# Patient Record
Sex: Female | Born: 1955 | Race: White | Hispanic: No | Marital: Married | Smoking: Former smoker
Health system: Southern US, Community
[De-identification: ages and names within clinical notes are randomized; demographics above are authoritative.]

## PROBLEM LIST (undated history)

## (undated) DIAGNOSIS — I10 Essential (primary) hypertension: Secondary | ICD-10-CM

## (undated) DIAGNOSIS — F329 Major depressive disorder, single episode, unspecified: Secondary | ICD-10-CM

## (undated) DIAGNOSIS — F419 Anxiety disorder, unspecified: Secondary | ICD-10-CM

## (undated) DIAGNOSIS — K449 Diaphragmatic hernia without obstruction or gangrene: Secondary | ICD-10-CM

## (undated) DIAGNOSIS — K219 Gastro-esophageal reflux disease without esophagitis: Secondary | ICD-10-CM

## (undated) DIAGNOSIS — F32A Depression, unspecified: Secondary | ICD-10-CM

## (undated) DIAGNOSIS — T7840XA Allergy, unspecified, initial encounter: Secondary | ICD-10-CM

## (undated) DIAGNOSIS — E785 Hyperlipidemia, unspecified: Secondary | ICD-10-CM

## (undated) HISTORY — PX: ABDOMINAL HYSTERECTOMY: SHX81

## (undated) HISTORY — DX: Allergy, unspecified, initial encounter: T78.40XA

## (undated) HISTORY — PX: TONSILLECTOMY: SUR1361

## (undated) HISTORY — DX: Anxiety disorder, unspecified: F41.9

## (undated) HISTORY — PX: APPENDECTOMY: SHX54

## (undated) HISTORY — DX: Diaphragmatic hernia without obstruction or gangrene: K44.9

## (undated) HISTORY — PX: TUBAL LIGATION: SHX77

---

## 2003-12-04 ENCOUNTER — Ambulatory Visit: Payer: Self-pay | Admitting: Family Medicine

## 2004-02-10 ENCOUNTER — Other Ambulatory Visit: Payer: Self-pay

## 2004-02-20 ENCOUNTER — Inpatient Hospital Stay: Payer: Self-pay | Admitting: Unknown Physician Specialty

## 2004-03-25 ENCOUNTER — Ambulatory Visit: Payer: Self-pay | Admitting: Family Medicine

## 2006-03-15 ENCOUNTER — Ambulatory Visit: Payer: Self-pay | Admitting: Family Medicine

## 2007-05-23 ENCOUNTER — Ambulatory Visit: Payer: Self-pay | Admitting: Family Medicine

## 2008-04-11 ENCOUNTER — Ambulatory Visit: Payer: Self-pay | Admitting: Family Medicine

## 2008-07-09 ENCOUNTER — Ambulatory Visit: Payer: Self-pay | Admitting: Gastroenterology

## 2009-05-27 ENCOUNTER — Ambulatory Visit: Payer: Self-pay | Admitting: Family Medicine

## 2010-07-07 ENCOUNTER — Ambulatory Visit: Payer: Self-pay | Admitting: Family Medicine

## 2011-06-28 ENCOUNTER — Encounter (HOSPITAL_BASED_OUTPATIENT_CLINIC_OR_DEPARTMENT_OTHER): Payer: Self-pay | Admitting: *Deleted

## 2011-06-28 ENCOUNTER — Other Ambulatory Visit: Payer: Self-pay | Admitting: Orthopedic Surgery

## 2011-06-28 NOTE — Progress Notes (Signed)
Will need ekg and istat-in [place] now

## 2011-06-29 ENCOUNTER — Encounter (HOSPITAL_BASED_OUTPATIENT_CLINIC_OR_DEPARTMENT_OTHER)
Admission: RE | Disposition: A | Discharge status: Home / Self Care | Payer: Self-pay | Source: Ambulatory Visit | Attending: Orthopedic Surgery

## 2011-06-29 ENCOUNTER — Other Ambulatory Visit: Payer: Self-pay | Admitting: Orthopedic Surgery

## 2011-06-29 ENCOUNTER — Encounter (HOSPITAL_BASED_OUTPATIENT_CLINIC_OR_DEPARTMENT_OTHER): Payer: Self-pay | Admitting: Anesthesiology

## 2011-06-29 ENCOUNTER — Ambulatory Visit (HOSPITAL_BASED_OUTPATIENT_CLINIC_OR_DEPARTMENT_OTHER): Payer: 59 | Admitting: Anesthesiology

## 2011-06-29 ENCOUNTER — Other Ambulatory Visit: Payer: Self-pay

## 2011-06-29 ENCOUNTER — Encounter (HOSPITAL_BASED_OUTPATIENT_CLINIC_OR_DEPARTMENT_OTHER): Payer: Self-pay | Admitting: *Deleted

## 2011-06-29 ENCOUNTER — Ambulatory Visit (HOSPITAL_BASED_OUTPATIENT_CLINIC_OR_DEPARTMENT_OTHER)
Admission: RE | Admit: 2011-06-29 | Discharge: 2011-06-29 | Disposition: A | Discharge status: Home / Self Care | Payer: 59 | Source: Ambulatory Visit | Attending: Orthopedic Surgery | Admitting: Orthopedic Surgery

## 2011-06-29 ENCOUNTER — Encounter (HOSPITAL_BASED_OUTPATIENT_CLINIC_OR_DEPARTMENT_OTHER): Payer: Self-pay | Admitting: Orthopedic Surgery

## 2011-06-29 DIAGNOSIS — I10 Essential (primary) hypertension: Secondary | ICD-10-CM | POA: Insufficient documentation

## 2011-06-29 DIAGNOSIS — M654 Radial styloid tenosynovitis [de Quervain]: Secondary | ICD-10-CM | POA: Insufficient documentation

## 2011-06-29 DIAGNOSIS — K219 Gastro-esophageal reflux disease without esophagitis: Secondary | ICD-10-CM | POA: Insufficient documentation

## 2011-06-29 HISTORY — DX: Essential (primary) hypertension: I10

## 2011-06-29 HISTORY — DX: Major depressive disorder, single episode, unspecified: F32.9

## 2011-06-29 HISTORY — DX: Depression, unspecified: F32.A

## 2011-06-29 HISTORY — PX: DORSAL COMPARTMENT RELEASE: SHX5039

## 2011-06-29 HISTORY — DX: Hyperlipidemia, unspecified: E78.5

## 2011-06-29 HISTORY — DX: Gastro-esophageal reflux disease without esophagitis: K21.9

## 2011-06-29 LAB — POCT I-STAT, CHEM 8
Glucose, Bld: 98 mg/dL (ref 70–99)
HCT: 42 % (ref 36.0–46.0)
Hemoglobin: 14.3 g/dL (ref 12.0–15.0)
Potassium: 3.5 mEq/L (ref 3.5–5.1)

## 2011-06-29 SURGERY — RELEASE, FIRST DORSAL COMPARTMENT, HAND
Anesthesia: General | Site: Hand | Laterality: Right | Wound class: Clean

## 2011-06-29 MED ORDER — BUPIVACAINE HCL (PF) 0.25 % IJ SOLN
INTRAMUSCULAR | Status: DC | PRN
  Administered 2011-06-29: 5 mL

## 2011-06-29 MED ORDER — CHLORHEXIDINE GLUCONATE 4 % EX LIQD: 60.0000 mL | Freq: Once | CUTANEOUS | Status: DC

## 2011-06-29 MED ORDER — MIDAZOLAM HCL 5 MG/5ML IJ SOLN
INTRAMUSCULAR | Status: DC | PRN
  Administered 2011-06-29: 2 mg via INTRAVENOUS

## 2011-06-29 MED ORDER — FENTANYL CITRATE 0.05 MG/ML IJ SOLN
INTRAMUSCULAR | Status: DC | PRN
  Administered 2011-06-29 (×2): 50 ug via INTRAVENOUS

## 2011-06-29 MED ORDER — ONDANSETRON HCL 4 MG/2ML IJ SOLN
INTRAMUSCULAR | Status: DC | PRN
  Administered 2011-06-29: 4 mg via INTRAVENOUS

## 2011-06-29 MED ORDER — LIDOCAINE HCL (CARDIAC) 20 MG/ML IV SOLN
INTRAVENOUS | Status: DC | PRN
  Administered 2011-06-29: 50 mg via INTRAVENOUS

## 2011-06-29 MED ORDER — HYDROMORPHONE HCL PF 1 MG/ML IJ SOLN
0.2500 mg | INTRAMUSCULAR | Status: DC | PRN
  Administered 2011-06-29: 0.25 mg via INTRAVENOUS
  Administered 2011-06-29: 0.5 mg via INTRAVENOUS

## 2011-06-29 MED ORDER — LACTATED RINGERS IV SOLN
INTRAVENOUS | Status: DC
  Administered 2011-06-29 (×2): via INTRAVENOUS

## 2011-06-29 MED ORDER — CEFAZOLIN SODIUM 1-5 GM-% IV SOLN
INTRAVENOUS | Status: DC | PRN
  Administered 2011-06-29: 1 g via INTRAVENOUS

## 2011-06-29 MED ORDER — PROPOFOL 10 MG/ML IV EMUL
INTRAVENOUS | Status: DC | PRN
  Administered 2011-06-29: 200 mg via INTRAVENOUS

## 2011-06-29 MED ORDER — DEXAMETHASONE SODIUM PHOSPHATE 4 MG/ML IJ SOLN
INTRAMUSCULAR | Status: DC | PRN
  Administered 2011-06-29: 10 mg via INTRAVENOUS

## 2011-06-29 MED ORDER — PENTAZOCINE-NALOXONE 50-0.5 MG PO TABS: 1.0000 | ORAL_TABLET | ORAL | Status: AC | PRN

## 2011-06-29 SURGICAL SUPPLY — 38 items
BANDAGE COBAN STERILE 2 (GAUZE/BANDAGES/DRESSINGS) IMPLANT
BANDAGE CONFORM 2  STR LF (GAUZE/BANDAGES/DRESSINGS) ×2 IMPLANT
BLADE MINI RND TIP GREEN BEAV (BLADE) IMPLANT
BLADE SURG 15 STRL LF DISP TIS (BLADE) ×2 IMPLANT
BLADE SURG 15 STRL SS (BLADE) ×2
BNDG ELASTIC 2 VLCR STRL LF (GAUZE/BANDAGES/DRESSINGS) ×2 IMPLANT
BNDG ESMARK 4X9 LF (GAUZE/BANDAGES/DRESSINGS) IMPLANT
CHLORAPREP W/TINT 26ML (MISCELLANEOUS) ×2 IMPLANT
CLOTH BEACON ORANGE TIMEOUT ST (SAFETY) ×2 IMPLANT
CORDS BIPOLAR (ELECTRODE) ×2 IMPLANT
COVER MAYO STAND STRL (DRAPES) ×2 IMPLANT
COVER TABLE BACK 60X90 (DRAPES) ×2 IMPLANT
CUFF TOURNIQUET SINGLE 18IN (TOURNIQUET CUFF) ×2 IMPLANT
DRAPE EXTREMITY T 121X128X90 (DRAPE) ×2 IMPLANT
DRAPE SURG 17X23 STRL (DRAPES) ×2 IMPLANT
GAUZE XEROFORM 1X8 LF (GAUZE/BANDAGES/DRESSINGS) ×2 IMPLANT
GLOVE BIO SURGEON STRL SZ 6.5 (GLOVE) ×2 IMPLANT
GLOVE BIO SURGEON STRL SZ7.5 (GLOVE) ×2 IMPLANT
GLOVE BIOGEL PI IND STRL 7.0 (GLOVE) ×1 IMPLANT
GLOVE BIOGEL PI INDICATOR 7.0 (GLOVE) ×1
GLOVE SURG ORTHO 8.0 STRL STRW (GLOVE) ×2 IMPLANT
GOWN PREVENTION PLUS XLARGE (GOWN DISPOSABLE) ×2 IMPLANT
GOWN STRL REIN XL XLG (GOWN DISPOSABLE) ×4 IMPLANT
NEEDLE HYPO 25X1 1.5 SAFETY (NEEDLE) ×2 IMPLANT
NS IRRIG 1000ML POUR BTL (IV SOLUTION) ×2 IMPLANT
PACK BASIN DAY SURGERY FS (CUSTOM PROCEDURE TRAY) ×2 IMPLANT
PADDING CAST ABS 4INX4YD NS (CAST SUPPLIES) ×1
PADDING CAST ABS COTTON 4X4 ST (CAST SUPPLIES) ×1 IMPLANT
SPLINT PLASTER CAST XFAST 3X15 (CAST SUPPLIES) ×1 IMPLANT
SPLINT PLASTER XTRA FASTSET 3X (CAST SUPPLIES) ×1
SPONGE GAUZE 4X4 12PLY (GAUZE/BANDAGES/DRESSINGS) ×2 IMPLANT
STOCKINETTE 4X48 STRL (DRAPES) ×2 IMPLANT
SUT ETHILON 4 0 PS 2 18 (SUTURE) ×2 IMPLANT
SYR BULB 3OZ (MISCELLANEOUS) ×2 IMPLANT
SYR CONTROL 10ML LL (SYRINGE) ×2 IMPLANT
TOWEL OR 17X24 6PK STRL BLUE (TOWEL DISPOSABLE) ×4 IMPLANT
UNDERPAD 30X30 INCONTINENT (UNDERPADS AND DIAPERS) ×2 IMPLANT
WATER STERILE IRR 1000ML POUR (IV SOLUTION) IMPLANT

## 2011-06-29 NOTE — Anesthesia Procedure Notes (Signed)
Procedure Name: LMA Insertion Date/Time: 06/29/2011 10:36 AM Performed by: Caren Macadam Pre-anesthesia Checklist: Patient identified, Emergency Drugs available, Suction available and Patient being monitored Patient Re-evaluated:Patient Re-evaluated prior to inductionOxygen Delivery Method: Circle System Utilized Preoxygenation: Pre-oxygenation with 100% oxygen Intubation Type: IV induction Ventilation: Mask ventilation without difficulty LMA: LMA inserted LMA Size: 4.0 Number of attempts: 1 Airway Equipment and Method: bite block Placement Confirmation: positive ETCO2 and breath sounds checked- equal and bilateral Tube secured with: Tape Dental Injury: Teeth and Oropharynx as per pre-operative assessment

## 2011-06-29 NOTE — Brief Op Note (Signed)
06/29/2011  10:58 AM  PATIENT:  Nicole Buchanan  56 y.o. female  PRE-OPERATIVE DIAGNOSIS:  right dequervains  POST-OPERATIVE DIAGNOSIS:  right dequervains  PROCEDURE:  Procedure(s) (LRB): RELEASE DORSAL COMPARTMENT (DEQUERVAIN) (Right)  SURGEON:  Surgeon(s) and Role:    * Nicki Reaper, MD - Primary    * Tami Ribas, MD - Assisting  PHYSICIAN ASSISTANT:   ASSISTANTS: Karlyn Agee, MD   ANESTHESIA:   local and general  EBL:  Total I/O In: 1400 [I.V.:1400] Out: -   BLOOD ADMINISTERED:none  DRAINS: none   LOCAL MEDICATIONS USED:  MARCAINE     SPECIMEN:  No Specimen  DISPOSITION OF SPECIMEN:  N/A  COUNTS:  YES  TOURNIQUET:   Total Tourniquet Time Documented: Upper Arm (Right) - 12 minutes  DICTATION: .Other Dictation: Dictation Number [id number]  PLAN OF CARE: Discharge to home after PACU  PATIENT DISPOSITION:  PACU - hemodynamically stable.

## 2011-06-29 NOTE — Anesthesia Postprocedure Evaluation (Signed)
  Anesthesia Post-op Note  Patient: Nicole Buchanan  Procedure(s) Performed: Procedure(s) (LRB): RELEASE DORSAL COMPARTMENT (DEQUERVAIN) (Right)  Patient Location: PACU  Anesthesia Type: General  Level of Consciousness: awake, alert  and oriented  Airway and Oxygen Therapy: Patient Spontanous Breathing  Post-op Pain: none  Post-op Assessment: Post-op Vital signs reviewed, Patient's Cardiovascular Status Stable, Respiratory Function Stable, Patent Airway, No signs of Nausea or vomiting and Pain level controlled  Post-op Vital Signs: Reviewed and stable  Complications: No apparent anesthesia complications

## 2011-06-29 NOTE — H&P (Signed)
[name] is a 56 year old former patient. She is left handed. She comes in complaining of her right wrist. She had a prior history of deQuervain's treated on her left side and resolved with injections. She has very similar symptoms on her right side that has been going on for the past 3 weeks. She has no history of injury to it. She complains of an intermittent sharp pain which will occasionally awaken her at night. It is severe in nature. She feels it is gradually getting worse. Activity and work makes this worse, rest makes it better. She has tried a brace and ibuprofen, none of which have helped. No history of diabetes, thyroid problems, arthritis or gout. There is a family history of diabetes and thyroid problems.  Past Medical History: She is on Simvastatin, Lisinopril, HCTZ, and Venlafaxine. She has an allergy to PCN and Codeine.  Family Medical History: Positive for diabetes, high BP and arthritis.  Social History: She does not smoke. She drinks socially. She is married and a Administrator, sports.  Review of Systems: Positive for glasses, high BP, depression, and headaches, otherwise negative. Nicole Buchanan is an 56 y.o. female.   Chief Complaint: dequervain's rt HPI:  See above  Past Medical History  Diagnosis Date  . Hypertension   . Depression   . GERD (gastroesophageal reflux disease)   . Hyperlipemia     Past Surgical History  Procedure Date  . Tonsillectomy   . Abdominal hysterectomy   . Cesarean section     History reviewed. No pertinent family history. Social History:  reports that she quit smoking about 10 years ago. She does not have any smokeless tobacco history on file. She reports that she drinks alcohol. She reports that she does not use illicit drugs.  Allergies:  Allergies  Allergen Reactions  . Codeine Nausea And Vomiting  . Penicillins     dizzy    Medications Prior to Admission  Medication Sig Dispense Refill  . hydrochlorothiazide (HYDRODIURIL) 25 MG  tablet Take 25 mg by mouth daily.      Marland Kitchen lisinopril (PRINIVIL,ZESTRIL) 5 MG tablet Take 5 mg by mouth daily.      Marland Kitchen omeprazole (PRILOSEC) 20 MG capsule Take 20 mg by mouth daily.      . simvastatin (ZOCOR) 20 MG tablet Take 20 mg by mouth every evening.      . venlafaxine (EFFEXOR) 75 MG tablet Take 75 mg by mouth 2 (two) times daily.        Results for orders placed during the hospital encounter of 06/29/11 (from the past 48 hour(s))  POCT I-STAT, CHEM 8     Status: Normal   Collection Time   06/29/11  9:15 AM      Component Value Range Comment   Sodium 141  135 - 145 (mEq/L)    Potassium 3.5  3.5 - 5.1 (mEq/L)    Chloride 106  96 - 112 (mEq/L)    BUN 13  6 - 23 (mg/dL)    Creatinine, Ser 1.61  0.50 - 1.10 (mg/dL)    Glucose, Bld 98  70 - 99 (mg/dL)    Calcium, Ion 0.96  1.12 - 1.32 (mmol/L)    TCO2 25  0 - 100 (mmol/L)    Hemoglobin 14.3  12.0 - 15.0 (g/dL)    HCT 04.5  40.9 - 81.1 (%)     No results found.   Pertinent items are noted in HPI.  Blood pressure 132/87, pulse 79, temperature 98.2  F (36.8 C), temperature source Oral, resp. rate 16, height 5\' 1"  (1.549 m), weight 63.504 kg (140 lb), SpO2 98.00%.  General appearance: alert, cooperative and appears stated age Head: Normocephalic, without obvious abnormality Neck: no adenopathy Resp: clear to auscultation bilaterally Cardio: regular rate and rhythm, S1, S2 normal, no murmur, click, rub or gallop GI: soft, non-tender; bowel sounds normal; no masses,  no organomegaly Extremities: extremities normal, atraumatic, no cyanosis or edema Pulses: 2+ and symmetric Skin: Skin color, texture, turgor normal. No rashes or lesions Neurologic: Grossly normal Incision/Wound: na  Assessment/Plan In that she is electing to proceed with the possibility of surgical decompression to the first dorsal compartment.  Deema Juncaj R 06/29/2011, 10:17 AM

## 2011-06-29 NOTE — Transfer of Care (Signed)
Immediate Anesthesia Transfer of Care Note  Patient: Nicole Buchanan  Procedure(s) Performed: Procedure(s) (LRB): RELEASE DORSAL COMPARTMENT (DEQUERVAIN) (Right)  Patient Location: PACU  Anesthesia Type: General  Level of Consciousness: awake and alert   Airway & Oxygen Therapy: Patient Spontanous Breathing and Patient connected to face mask oxygen  Post-op Assessment: Report given to PACU RN and Post -op Vital signs reviewed and stable  Post vital signs: Reviewed and stable  Complications: No apparent anesthesia complications

## 2011-06-29 NOTE — Op Note (Signed)
Nicole Buchanan, DEPREE NO.:  [id number]  MEDICAL RECORD NO.:  [id number]  LOCATION:                                 FACILITY:  PHYSICIAN:  Cindee Salt, M.D.            DATE OF BIRTH:  DATE OF PROCEDURE:  06/29/2011 DATE OF DISCHARGE:                              OPERATIVE REPORT   PREOPERATIVE DIAGNOSIS:  [name] tendinitis, right wrist.  POSTOP DIAGNOSIS:  [name] tendinitis, right wrist.  OPERATION:  Decompression of first dorsal compartment with excision of septum, right wrist.  SURGEON:  Cindee Salt, M.D.  ASSISTANT:  Betha Loa, MD  ANESTHESIA:  General with local infiltration.  ANESTHESIOLOGIST:  Dr. Jean Rosenthal.  HISTORY:  The patient is a 56 year old female with a history of De Quervain tendinitis.  This has not responded to conservative treatment. She has elected to undergo surgical decompression that has been present for a year.  Preoperative area, the patient is seen, the extremity marked by both the patient and surgeon.  Antibiotic given.  PROCEDURE:  The patient was brought to the operating room where a general anesthetic was carried out without difficulty.  She was prepped using ChloraPrep, supine position with the right arm free.  A 3 minutes dry time was allowed.  Time-out taken, confirming the patient and procedure.  After adequate anesthesia was afforded, a longitudinal incision was made in the skin over the first dorsal compartment, carried down through subcutaneous tissue.  The dorsal, sensory, radial nerve branches were identified and protected.  The dissection carried down to the first dorsal compartment.  This was incised on its most dorsal aspect.  The extensor pollicis brevis tendon was identified.  This was found to be in a separate sheath from the abductor pollicis longus.  The septum was excised releasing both the tendons.  Full flexion and extension was present, moderate tenosynovitis was present.  The wound was  copiously irrigated with saline.  The radial sensory nerve was protected throughout the procedure.  The skin was then closed with a subcuticular 4-0 Vicryl Rapide suture.  The wrist was placed through full range motion prior to closure revealing no subluxation to the extensor tendons.  A sterile compressive dressing, thumb spica splint was applied.  On deflation of the tourniquet, all fingers immediately pinked.  She was taken to the recovery room.          ______________________________ Cindee Salt, M.D.     GK/MEDQ  D:  06/29/2011  T:  06/29/2011  Job:  [id number]

## 2011-06-29 NOTE — Op Note (Signed)
Dictated number: [id number]

## 2011-06-29 NOTE — Anesthesia Preprocedure Evaluation (Signed)
Anesthesia Evaluation  Patient identified by MRN, date of birth, ID band Patient awake    Reviewed: Allergy & Precautions, H&P , NPO status , Patient's Chart, lab work & pertinent test results  History of Anesthesia Complications Negative for: history of anesthetic complications  Airway Mallampati: I TM Distance: >3 FB Neck ROM: Full    Dental No notable dental hx. (+) Teeth Intact and Dental Advisory Given   Pulmonary neg pulmonary ROS, former smoker (remote smoker ) breath sounds clear to auscultation  Pulmonary exam normal       Cardiovascular hypertension, Pt. on medications Rhythm:Regular Rate:Normal     Neuro/Psych negative neurological ROS     GI/Hepatic Neg liver ROS, GERD-  Medicated and Controlled,  Endo/Other  negative endocrine ROS  Renal/GU negative Renal ROS     Musculoskeletal   Abdominal   Peds  Hematology   Anesthesia Other Findings   Reproductive/Obstetrics                           Anesthesia Physical Anesthesia Plan  ASA: II  Anesthesia Plan: General   Post-op Pain Management:    Induction: Intravenous  Airway Management Planned: LMA  Additional Equipment:   Intra-op Plan:   Post-operative Plan:   Informed Consent: I have reviewed the patients History and Physical, chart, labs and discussed the procedure including the risks, benefits and alternatives for the proposed anesthesia with the patient or authorized representative who has indicated his/her understanding and acceptance.   Dental advisory given  Plan Discussed with: CRNA and Surgeon  Anesthesia Plan Comments: (Plan routine monitors, GA- LMA OK)        Anesthesia Quick Evaluation

## 2011-06-29 NOTE — Discharge Instructions (Addendum)

## 2011-06-30 ENCOUNTER — Encounter (HOSPITAL_BASED_OUTPATIENT_CLINIC_OR_DEPARTMENT_OTHER): Payer: Self-pay | Admitting: Orthopedic Surgery

## 2013-09-17 ENCOUNTER — Ambulatory Visit: Payer: Self-pay | Admitting: Family Medicine

## 2015-05-22 ENCOUNTER — Other Ambulatory Visit: Payer: Self-pay | Admitting: Family Medicine

## 2015-05-22 DIAGNOSIS — Z1231 Encounter for screening mammogram for malignant neoplasm of breast: Secondary | ICD-10-CM

## 2015-06-03 ENCOUNTER — Ambulatory Visit
Admission: RE | Admit: 2015-06-03 | Discharge: 2015-06-03 | Disposition: A | Discharge status: Home / Self Care | Payer: Managed Care, Other (non HMO) | Source: Ambulatory Visit | Attending: Family Medicine | Admitting: Family Medicine

## 2015-06-03 DIAGNOSIS — Z1231 Encounter for screening mammogram for malignant neoplasm of breast: Secondary | ICD-10-CM | POA: Diagnosis present

## 2015-11-24 ENCOUNTER — Emergency Department: Payer: Managed Care, Other (non HMO)

## 2015-11-24 ENCOUNTER — Encounter: Payer: Self-pay | Admitting: Emergency Medicine

## 2015-11-24 ENCOUNTER — Emergency Department
Admission: EM | Admit: 2015-11-24 | Discharge: 2015-11-24 | Disposition: A | Discharge status: Home / Self Care | Payer: Managed Care, Other (non HMO) | Attending: Emergency Medicine | Admitting: Emergency Medicine

## 2015-11-24 DIAGNOSIS — I10 Essential (primary) hypertension: Secondary | ICD-10-CM | POA: Insufficient documentation

## 2015-11-24 DIAGNOSIS — Z79899 Other long term (current) drug therapy: Secondary | ICD-10-CM | POA: Diagnosis not present

## 2015-11-24 DIAGNOSIS — R0602 Shortness of breath: Secondary | ICD-10-CM

## 2015-11-24 DIAGNOSIS — R079 Chest pain, unspecified: Secondary | ICD-10-CM

## 2015-11-24 DIAGNOSIS — Z87891 Personal history of nicotine dependence: Secondary | ICD-10-CM | POA: Insufficient documentation

## 2015-11-24 DIAGNOSIS — R0789 Other chest pain: Secondary | ICD-10-CM | POA: Diagnosis not present

## 2015-11-24 LAB — COMPREHENSIVE METABOLIC PANEL
ALBUMIN: 4.3 g/dL (ref 3.5–5.0)
ALK PHOS: 108 U/L (ref 38–126)
ALT: 29 U/L (ref 14–54)
ANION GAP: 8 (ref 5–15)
AST: 23 U/L (ref 15–41)
BILIRUBIN TOTAL: 0.6 mg/dL (ref 0.3–1.2)
BUN: 14 mg/dL (ref 6–20)
CALCIUM: 9.6 mg/dL (ref 8.9–10.3)
CO2: 27 mmol/L (ref 22–32)
Chloride: 104 mmol/L (ref 101–111)
Creatinine, Ser: 0.71 mg/dL (ref 0.44–1.00)
Glucose, Bld: 90 mg/dL (ref 65–99)
POTASSIUM: 3.4 mmol/L — AB (ref 3.5–5.1)
Sodium: 139 mmol/L (ref 135–145)
TOTAL PROTEIN: 7.5 g/dL (ref 6.5–8.1)

## 2015-11-24 LAB — CBC
HEMATOCRIT: 41.2 % (ref 35.0–47.0)
Hemoglobin: 14 g/dL (ref 12.0–16.0)
MCH: 30.2 pg (ref 26.0–34.0)
MCHC: 33.9 g/dL (ref 32.0–36.0)
MCV: 89.1 fL (ref 80.0–100.0)
Platelets: 240 10*3/uL (ref 150–440)
RBC: 4.62 MIL/uL (ref 3.80–5.20)
RDW: 14.3 % (ref 11.5–14.5)
WBC: 5.2 10*3/uL (ref 3.6–11.0)

## 2015-11-24 LAB — TROPONIN I

## 2015-11-24 LAB — FIBRIN DERIVATIVES D-DIMER (ARMC ONLY): FIBRIN DERIVATIVES D-DIMER (ARMC): 506 — AB (ref 0–499)

## 2015-11-24 MED ORDER — LORAZEPAM 1 MG PO TABS
2.0000 mg | ORAL_TABLET | Freq: Once | ORAL | Status: AC
  Administered 2015-11-24: 2 mg via ORAL
  Filled 2015-11-24: qty 2

## 2015-11-24 MED ORDER — IOPAMIDOL (ISOVUE-370) INJECTION 76%
75.0000 mL | Freq: Once | INTRAVENOUS | Status: AC | PRN
Start: 2015-11-24 — End: 2015-11-24
  Administered 2015-11-24: 75 mL via INTRAVENOUS
  Filled 2015-11-24: qty 75

## 2015-11-24 NOTE — ED Triage Notes (Signed)
States has had chest pain x 3 to 4 days, 2 days ago episode of diaphoresis. Sensation of being unable to take a deep breath and intermittent SOB.

## 2015-11-24 NOTE — ED Notes (Signed)
Did not close incision in leg; [facility]

## 2015-11-24 NOTE — ED Provider Notes (Signed)
Time Seen: Approximately1532  I have reviewed the triage notes  Chief Complaint: Chest Pain   History of Present Illness: Nicole Buchanan is a 60 y.o. female who presents with a 3 to four-day history of intermittent waves of chest discomfort the last anywhere from 5-10 minutes. She states she had one episode Saturday where she felt diaphoretic. She has some shortness of breath associated with the numbness and tingling in both of her hands. She denies any weakness. She denies any arm or jaw pain. She denies any pain toward the middle of the back or flank region. She is not aware of any relieving or exacerbating factors. She states she does have a history of anxiety and is currently under treatment by her primary physician. She states she's had a history of feelings of heart palpitations that was evaluated by cardiology with a Holter monitor. She has no strong cardiac risk factors except for remote family history and some hypertension.   Past Medical History:  Diagnosis Date  . Depression   . GERD (gastroesophageal reflux disease)   . Hyperlipemia   . Hypertension     There are no active problems to display for this patient.   Past Surgical History:  Procedure Laterality Date  . ABDOMINAL HYSTERECTOMY    . CESAREAN SECTION    . DORSAL COMPARTMENT RELEASE  06/29/2011   Procedure: RELEASE DORSAL COMPARTMENT (DEQUERVAIN);  Surgeon: Nicki Reaper, MD;  Location: [facility];  Service: Orthopedics;  Laterality: Right;  . TONSILLECTOMY      Past Surgical History:  Procedure Laterality Date  . ABDOMINAL HYSTERECTOMY    . CESAREAN SECTION    . DORSAL COMPARTMENT RELEASE  06/29/2011   Procedure: RELEASE DORSAL COMPARTMENT (DEQUERVAIN);  Surgeon: Nicki Reaper, MD;  Location: [facility];  Service: Orthopedics;  Laterality: Right;  . TONSILLECTOMY      Current Outpatient Rx  . Order #: [id number] Class: Historical Med  . Order #: [id number] Class: Historical Med  .  Order #: [id number] Class: Historical Med  . Order #: [id number] Class: Historical Med  . Order #: [id number] Class: Historical Med    Allergies:  Codeine and Penicillins  Family History: Family History  Problem Relation Age of Onset  . Breast cancer Cousin     maternal side -50's    Social History: Social History  Substance Use Topics  . Smoking status: Former Smoker    Quit date: 06/27/2001  . Smokeless tobacco: Not on file  . Alcohol use Yes     Review of Systems:   10 point review of systems was performed and was otherwise negative:  Constitutional: No fever Eyes: No visual disturbances ENT: No sore throat, ear pain Cardiac: no current chest discomfort in the last episode was just prior to arrival at 11:00 Respiratory: No shortness of breath, wheezing, or stridor Abdomen: No abdominal pain, no vomiting, No diarrhea Endocrine: No weight loss, No night sweats Extremities: No peripheral edema, cyanosis Skin: No rashes, easy bruising Neurologic: No focal weakness, trouble with speech or swollowing Urologic: No dysuria, Hematuria, or urinary frequency   Physical Exam:  ED Triage Vitals  Enc Vitals Group     BP 11/24/15 1157 139/85     Pulse Rate 11/24/15 1157 78     Resp 11/24/15 1157 18     Temp 11/24/15 1157 97.9 F (36.6 C)     Temp Source 11/24/15 1157 Oral     SpO2 11/24/15 1157 100 %     Weight  11/24/15 1158 150 lb (68 kg)     Height 11/24/15 1158 5\' 1"  (1.549 m)     Head Circumference --      Peak Flow --      Pain Score 11/24/15 1158 8     Pain Loc --      Pain Edu? --      Excl. in GC? --     General: Awake , Alert , and Oriented times 3; GCS 15 Head: Normal cephalic , atraumatic Eyes: Pupils equal , round, reactive to light Nose/Throat: No nasal drainage, patent upper airway without erythema or exudate.  Neck: Supple, Full range of motion, No anterior adenopathy or palpable thyroid masses Lungs: Clear to ascultation without wheezes , rhonchi, or  rales Heart: Regular rate, regular rhythm without murmurs , gallops , or rubs Abdomen: Soft, non tender without rebound, guarding , or rigidity; bowel sounds positive and symmetric in all 4 quadrants. No organomegaly .        Extremities: 2 plus symmetric pulses. No edema, clubbing or cyanosis Neurologic: normal ambulation, Motor symmetric without deficits, sensory intact Skin: warm, dry, no rashes   Labs:   All laboratory work was reviewed including any pertinent negatives or positives listed below:  Labs Reviewed  COMPREHENSIVE METABOLIC PANEL - Abnormal; Notable for the following:       Result Value   Potassium 3.4 (*)    All other components within normal limits  FIBRIN DERIVATIVES D-DIMER ([facility] ONLY) - Abnormal; Notable for the following:    Fibrin derivatives D-dimer ([facility]) 506 (*)    All other components within normal limits  CBC  TROPONIN I  TROPONIN I  Initial and repeat troponin at 3 hours were negative D-dimer test was only slightly elevated  EKG: #1  ED ECG REPORT I, Jennye Moccasin, the attending physician, personally viewed and interpreted this ECG.  Date: 11/24/2015 EKG Time: 1155 Rate: 79 Rhythm: normal sinus rhythm QRS Axis: normal Intervals: normal ST/T Wave abnormalities: normal Conduction Disturbances: none Narrative Interpretation: unremarkable Poor R-wave progression in the anterior leads No acute ischemic changes noted  EKG #2 ED ECG REPORT I, Jennye Moccasin, the attending physician, personally viewed and interpreted this ECG.  Date: 11/24/2015 EKG Time:1535 Rate: 81 Rhythm: normal sinus rhythm QRS Axis: normal Intervals: normal ST/T Wave abnormalities:Slightly prolonged QT interval Conduction Disturbances: none Narrative Interpretation: unremarkable No acute ischemic changes  Radiology:  "Dg Chest 2 View  Result Date: 11/24/2015 CLINICAL DATA:  Chest pain. EXAM: CHEST  2 VIEW COMPARISON:  No recent prior. FINDINGS: Mediastinum and  hilar structures normal. Lungs are clear. Heart size normal. No pleural effusion or pneumothorax. No acute bony abnormality. IMPRESSION: No acute abnormality. Electronically Signed   By: Maisie Fus  Register   On: 11/24/2015 12:32   Ct Angio Chest Pe W Or Wo Contrast  Result Date: 11/24/2015 CLINICAL DATA:  Chest pain, shortness of breath, pain with inspiration EXAM: CT ANGIOGRAPHY CHEST WITH CONTRAST TECHNIQUE: Multidetector CT imaging of the chest was performed using the standard protocol during bolus administration of intravenous contrast. Multiplanar CT image reconstructions and MIPs were obtained to evaluate the vascular anatomy. CONTRAST:  75 mL Isovue 370 IV COMPARISON:  None. FINDINGS: Cardiovascular: Left arm IV contrast administration. Innominate vein and SVC patent. Right atrium and right ventricle are nondilated. Satisfactory opacification of pulmonary arteries noted, and there is no evidence of pulmonary emboli. Patent bilateral pulmonary draining veins drain into the left atrium. Adequate contrast opacification of the thoracic aorta  with no evidence of dissection, aneurysm, or stenosis. There is bovine variant brachiocephalic arch anatomy without proximal stenosis. No significant atheromatous irregularity. Mediastinum/Nodes: Moderate hiatal hernia. No pericardial effusion. No adenopathy. Lungs/Pleura: Minimum deep scratch the minimal dependent atelectasis posteriorly in both lower lobes. Lungs otherwise clear. No effusion. No pneumothorax. Upper Abdomen: Hiatal hernia Musculoskeletal: No chest wall abnormality. No acute or significant osseous findings. Review of the MIP images confirms the above findings. IMPRESSION: 1. Negative for acute PE or thoracic aortic dissection. 2. Moderate hiatal hernia. Electronically Signed   By: Corlis Leak  Hassell M.D.   On: 11/24/2015 16:48  "  I personally reviewed the radiologic studies   ED Course:  Differential includes all life-threatening causes for chest pain. This  includes but is not exclusive to acute coronary syndrome, aortic dissection, pulmonary embolism, cardiac tamponade, community-acquired pneumonia, pericarditis, musculoskeletal chest wall pain, etc.  Given the patient's current clinical presentation and objective findings I was not sure the exact nature of her chest discomfort. I felt there may be an anxiety component with the numbness and tingling in the feelings of shortness of breath. Given the negative CT scan I felt we could stop the investigation for pulmonary embolism. Patient had troponins done and sequence which did not show any changes and is no ischemic changes on her EKG. She had an episode where she states she "" didn't feel"" and had a repeat EKG which again did not show any acute ischemic changes I felt this was unlikely to be a life-threatening cause for chest pain advised patient I felt we can continue workup on an outpatient basis.she was advised to workup is not complete and she needed to contact the cardiologist (Dr. Lady GaryFath) for further outpatient investigation Clinical Course     Assessment:  Acute atypical chest pain   Final Clinical Impression:   Final diagnoses:  Shortness of breath  Nonspecific chest pain     Plan: Patient was advised to return immediately if condition worsens. Patient was advised to follow up with their primary care physician or other specialized physicians involved in their outpatient care. The patient and/or family member/power of attorney had laboratory results reviewed at the bedside. All questions and concerns were addressed and appropriate discharge instructions were distributed by the nursing staff.             Jennye MoccasinBrian S Cecilia Vancleve, MD 11/24/15 [phone]

## 2015-11-24 NOTE — ED Notes (Signed)
Pt discharged home after verbalizing understanding of discharge instructions; nad noted. 

## 2015-11-24 NOTE — Discharge Instructions (Signed)
Please return immediately if condition worsens. Please contact her primary physician or the physician you were given for referral. If you have any specialist physicians involved in her treatment and plan please also contact them. Thank you for using [facility].  Please take Tylenol for pain. Please continue a baby aspirin a day. Reported nonsteroidal medications such as ibuprofen, Naprosyn, etc. You  may want to take your omeprazole twice a day.

## 2015-11-24 NOTE — ED Notes (Signed)
Pt in CT.

## 2015-11-24 NOTE — ED Notes (Signed)
She also says she has dizziness with the chest pain.

## 2016-07-23 ENCOUNTER — Encounter: Payer: Self-pay | Admitting: Primary Care

## 2016-07-23 ENCOUNTER — Ambulatory Visit (INDEPENDENT_AMBULATORY_CARE_PROVIDER_SITE_OTHER): Payer: Managed Care, Other (non HMO) | Admitting: Primary Care

## 2016-07-23 VITALS — BP 124/84 | HR 60 | Temp 98.3°F | Ht 61.0 in | Wt 145.8 lb

## 2016-07-23 DIAGNOSIS — G2581 Restless legs syndrome: Secondary | ICD-10-CM | POA: Diagnosis not present

## 2016-07-23 DIAGNOSIS — I10 Essential (primary) hypertension: Secondary | ICD-10-CM | POA: Diagnosis not present

## 2016-07-23 DIAGNOSIS — E785 Hyperlipidemia, unspecified: Secondary | ICD-10-CM | POA: Insufficient documentation

## 2016-07-23 DIAGNOSIS — F329 Major depressive disorder, single episode, unspecified: Secondary | ICD-10-CM | POA: Diagnosis not present

## 2016-07-23 DIAGNOSIS — R002 Palpitations: Secondary | ICD-10-CM

## 2016-07-23 DIAGNOSIS — F419 Anxiety disorder, unspecified: Secondary | ICD-10-CM | POA: Diagnosis not present

## 2016-07-23 DIAGNOSIS — K219 Gastro-esophageal reflux disease without esophagitis: Secondary | ICD-10-CM | POA: Diagnosis not present

## 2016-07-23 DIAGNOSIS — F32A Depression, unspecified: Secondary | ICD-10-CM | POA: Insufficient documentation

## 2016-07-23 MED ORDER — GABAPENTIN 600 MG PO TABS: 600.0000 mg | ORAL_TABLET | Freq: Every day | ORAL | 1 refills | Status: DC

## 2016-07-23 MED ORDER — HYDROCHLOROTHIAZIDE 25 MG PO TABS: 25.0000 mg | ORAL_TABLET | Freq: Every day | ORAL | 1 refills | Status: DC

## 2016-07-23 MED ORDER — LISINOPRIL 5 MG PO TABS: 5.0000 mg | ORAL_TABLET | Freq: Every day | ORAL | 1 refills | Status: DC

## 2016-07-23 MED ORDER — VENLAFAXINE HCL ER 37.5 MG PO CP24: 37.5000 mg | ORAL_CAPSULE | Freq: Every day | ORAL | 0 refills | Status: DC

## 2016-07-23 MED ORDER — ATORVASTATIN CALCIUM 40 MG PO TABS: 40.0000 mg | ORAL_TABLET | Freq: Every evening | ORAL | 1 refills | Status: DC

## 2016-07-23 NOTE — Assessment & Plan Note (Signed)
Intermittent for years. Full cardiac work up in years past, ED cardiac work up in Fall 2017. Suspect palpitations related to anxiety, will increase Effexor dose and continue to monitor. Exam unremarkable.

## 2016-07-23 NOTE — Patient Instructions (Addendum)
I sent refills of your medication to your pharmacy.  We've increased the dose of your Effexor to 37.5 mg. This is an extended release dose. Take 1 tablet by mouth every morning with breakfast.  Please schedule a physical with me in the Fall of 2018. You may also schedule a lab only appointment 3-4 days prior. We will discuss your lab results in detail during your physical.  Follow up in 6 weeks for re-evaluation of anxiety and palpitations.  It was a pleasure to meet you today! Please don't hesitate to call me with any questions. Welcome to [facility]!

## 2016-07-23 NOTE — Assessment & Plan Note (Signed)
Managed on atorvastatin, endorses normal lipid panel in Fall 2017. Will obtain records. Refills provided.

## 2016-07-23 NOTE — Assessment & Plan Note (Signed)
Stable in the office today. Continue HCTZ and lisinopril. Will check records for BMP. Refills sent to pharmacy.

## 2016-07-23 NOTE — Assessment & Plan Note (Signed)
Well managed on gabapentin 600 mg. Continue same.

## 2016-07-23 NOTE — Assessment & Plan Note (Signed)
Stable on PPI. Continue same.  

## 2016-07-23 NOTE — Assessment & Plan Note (Signed)
Anxiety seems uncontrolled. GAD 7 score of 13 on Effexor 25 mg daily. Suspect anxiety to be cause of palpitations. Increased Effexor to XR 37.5 mg. Follow up in 6 weeks for re-evaluation.

## 2016-07-23 NOTE — Progress Notes (Signed)
Subjective:    Patient ID: Nicole Buchanan, female    DOB: [birth year], 62 y.o.   MRN: [id number]  HPI  Nicole Buchanan is a 61 year old female who presents today to establish care and discuss the problems mentioned below. Will obtain old records. Her last physical was in Fall 2017.   1) Essential Hypertension: Currently managed on HCTZ 25 mg and lisinopril 5 mg. Her BP in the office today is 124/84. She denies dizziness, fatigue, chest pain.  2) Hyperlipidemia: Currently managed on atorvastatin 40 mg. Her last lipid panel was in Fall 2017 and believes it was improved. She was previously taking Simvastatin and was switched to atorvastatin 1 year ago.  3) GERD: Currently managed on omeprazole 20 mg. She will experience symptoms of esophageal burning, belching without her medication. She feels well managed on her current regimen.  4) Anxiety and Depression: Diagnosed years ago. Currently managed on Effexor 25 mg. She was originally managed on Effexor 75 mg once daily and has weaned herself down to 25 mg about 8 months ago. She denies SI/HI. She has noticed an increase in anxiety which has caused more palpitations. See below.  5) Palpitations: Began several years ago, intermittent. She underwent Holter Monitor evaluation several years ago which was negative. She presented to [facility] ED during the Fall of 2017 and underwent complete cardiac work up which was negative for cardiac cause. Her symptoms were considered to be anxiety in nature as her palpitations improved with Ativan. Occurs intermittently which occurs mostly after eating, sometimes without regards to eating. She feels the palpitations to her throat and substernal chest.   She does notice anxiety prior to her palpitations, her anxiety will increase once she does experience palpitations. She will experience anxiety three days weekly. GAD 7 score of 13.   6) Restless Legs: More so restless feet that occur at night when resting. Bilateral feet. She  is currently managed on gabapentin 600 mg and feels well managed.   Review of Systems  Constitutional: Negative for fatigue.  Eyes: Negative for visual disturbance.  Respiratory: Negative for shortness of breath.   Cardiovascular: Positive for palpitations. Negative for chest pain.  Gastrointestinal: Negative for nausea.       GERD  Genitourinary:       Hysterectomy  Skin: Negative for color change.  Neurological: Negative for dizziness and headaches.       Restless leg syndrome  Hematological: Negative for adenopathy.  Psychiatric/Behavioral: The patient is nervous/anxious.        Past Medical History:  Diagnosis Date  . Depression   . GERD (gastroesophageal reflux disease)   . Hiatal hernia   . Hyperlipemia   . Hypertension      Social History   Social History  . Marital status: Married    Spouse name: N/A  . Number of children: N/A  . Years of education: N/A   Occupational History  . Not on file.   Social History Main Topics  . Smoking status: Former Smoker    Quit date: 06/27/2001  . Smokeless tobacco: Never Used  . Alcohol use Yes  . Drug use: No  . Sexual activity: Not on file   Other Topics Concern  . Not on file   Social History Narrative   Married.   2 children. 1 grandson.   Works as a Futures trader.   Enjoys spending time with family, gardening.    Past Surgical History:  Procedure Laterality Date  . ABDOMINAL HYSTERECTOMY    .  CESAREAN SECTION    . DORSAL COMPARTMENT RELEASE  06/29/2011   Procedure: RELEASE DORSAL COMPARTMENT (DEQUERVAIN);  Surgeon: Nicki ReaperGary R Kuzma, MD;  Location: [facility];  Service: Orthopedics;  Laterality: Right;  . TONSILLECTOMY      Family History  Problem Relation Age of Onset  . Breast cancer Cousin        maternal side -50's  . COPD Father     Allergies  Allergen Reactions  . Codeine Nausea And Vomiting  . Penicillins     dizzy    Current Outpatient Prescriptions on File Prior to Visit    Medication Sig Dispense Refill  . omeprazole (PRILOSEC) 20 MG capsule Take 20 mg by mouth daily.     No current facility-administered medications on file prior to visit.     BP 124/84   Pulse 60   Temp 98.3 F (36.8 C) (Oral)   Ht 5\' 1"  (1.549 m)   Wt 145 lb 12.8 oz (66.1 kg)   SpO2 99%   BMI 27.55 kg/m    Objective:   Physical Exam  Constitutional: She is oriented to person, place, and time. She appears well-nourished.  Neck: Neck supple.  Cardiovascular: Normal rate and regular rhythm.   Pulmonary/Chest: Effort normal and breath sounds normal.  Abdominal: Soft.  Neurological: She is alert and oriented to person, place, and time.  Skin: Skin is warm and dry.  Psychiatric: She has a normal mood and affect.          Assessment & Plan:

## 2016-09-02 ENCOUNTER — Other Ambulatory Visit: Payer: Self-pay | Admitting: Primary Care

## 2016-09-02 DIAGNOSIS — Z1231 Encounter for screening mammogram for malignant neoplasm of breast: Secondary | ICD-10-CM

## 2016-09-03 ENCOUNTER — Encounter: Payer: Self-pay | Admitting: Primary Care

## 2016-09-03 ENCOUNTER — Ambulatory Visit (INDEPENDENT_AMBULATORY_CARE_PROVIDER_SITE_OTHER): Payer: Managed Care, Other (non HMO) | Admitting: Primary Care

## 2016-09-03 DIAGNOSIS — F329 Major depressive disorder, single episode, unspecified: Secondary | ICD-10-CM | POA: Diagnosis not present

## 2016-09-03 DIAGNOSIS — F419 Anxiety disorder, unspecified: Secondary | ICD-10-CM | POA: Diagnosis not present

## 2016-09-03 DIAGNOSIS — F32A Depression, unspecified: Secondary | ICD-10-CM

## 2016-09-03 NOTE — Assessment & Plan Note (Signed)
Improved since increase in Effexor dose and switch to XR version. She will continue the 37.5 mg dose and update in several weeks if she feels she needs an increase. Continue current dose for now.

## 2016-09-03 NOTE — Patient Instructions (Signed)
Continue Effexor XR 37.5 mg tablets for anxiety and update me as discussed.   It was a pleasure to see you today!

## 2016-09-03 NOTE — Progress Notes (Signed)
Subjective:    Patient ID: Nicole Buchanan, female    DOB: 1955-11-27, 62 y.o.   MRN: [id number]  HPI  Nicole Buchanan is a 61 year old female who presents today for follow up of anxiety. During her visit in early June she endorsed symptoms of anxiety and had a GAD 7 score of 13 on Effexor 25 mg that she was taking once daily. Her Effexor was switched to XR 37.5 mg once daily during that visit.  Since her last visit she's feeling "much better". Positive effects include feeling calmer, ability to deal with stressful events. She has taken a few "extra" doses three times since her last visit due to increased anxiety, overall feels well on one capsule. She denies SI/HI, headaches, nausea.  Review of Systems  Respiratory: Negative for shortness of breath.   Cardiovascular: Negative for chest pain.  Gastrointestinal: Negative for nausea.  Neurological: Negative for headaches.  Psychiatric/Behavioral:       Anxiety is overall improved       Past Medical History:  Diagnosis Date  . Depression   . GERD (gastroesophageal reflux disease)   . Hiatal hernia   . Hyperlipemia   . Hypertension      Social History   Social History  . Marital status: Married    Spouse name: N/A  . Number of children: N/A  . Years of education: N/A   Occupational History  . Not on file.   Social History Main Topics  . Smoking status: Former Smoker    Quit date: 06/27/2001  . Smokeless tobacco: Never Used  . Alcohol use Yes  . Drug use: No  . Sexual activity: Not on file   Other Topics Concern  . Not on file   Social History Narrative   Married.   2 children. 1 grandson.   Works as a Futures trader.   Enjoys spending time with family, gardening.    Past Surgical History:  Procedure Laterality Date  . ABDOMINAL HYSTERECTOMY    . CESAREAN SECTION    . DORSAL COMPARTMENT RELEASE  06/29/2011   Procedure: RELEASE DORSAL COMPARTMENT (DEQUERVAIN);  Surgeon: Nicki Reaper, MD;  Location: [facility];  Service: Orthopedics;  Laterality: Right;  . TONSILLECTOMY      Family History  Problem Relation Age of Onset  . Breast cancer Cousin        maternal side -50's  . COPD Father     Allergies  Allergen Reactions  . Codeine Nausea And Vomiting  . Penicillins     dizzy    Current Outpatient Prescriptions on File Prior to Visit  Medication Sig Dispense Refill  . aspirin EC 81 MG tablet Take by mouth.    Marland Kitchen atorvastatin (LIPITOR) 40 MG tablet Take 1 tablet (40 mg total) by mouth every evening. 90 tablet 1  . gabapentin (NEURONTIN) 600 MG tablet Take 1 tablet (600 mg total) by mouth at bedtime. 90 tablet 1  . hydrochlorothiazide (HYDRODIURIL) 25 MG tablet Take 1 tablet (25 mg total) by mouth daily. 90 tablet 1  . lisinopril (PRINIVIL,ZESTRIL) 5 MG tablet Take 1 tablet (5 mg total) by mouth daily. 90 tablet 1  . omeprazole (PRILOSEC) 20 MG capsule Take 20 mg by mouth daily.    Marland Kitchen venlafaxine XR (EFFEXOR XR) 37.5 MG 24 hr capsule Take 1 capsule (37.5 mg total) by mouth daily with breakfast. 90 capsule 0   No current facility-administered medications on file prior to visit.  BP 124/84   Pulse 68   Temp 98.3 F (36.8 C) (Oral)   Ht 5\' 1"  (1.549 m)   Wt 148 lb 6.4 oz (67.3 kg)   SpO2 96%   BMI 28.04 kg/m    Objective:   Physical Exam  Constitutional: She appears well-nourished.  Neck: Neck supple.  Cardiovascular: Normal rate and regular rhythm.   Pulmonary/Chest: Effort normal and breath sounds normal.  Skin: Skin is warm and dry.  Psychiatric: She has a normal mood and affect.          Assessment & Plan:

## 2016-09-22 ENCOUNTER — Ambulatory Visit
Admission: RE | Admit: 2016-09-22 | Discharge: 2016-09-22 | Disposition: A | Discharge status: Home / Self Care | Payer: Managed Care, Other (non HMO) | Source: Ambulatory Visit | Attending: Primary Care | Admitting: Primary Care

## 2016-09-22 DIAGNOSIS — Z1231 Encounter for screening mammogram for malignant neoplasm of breast: Secondary | ICD-10-CM | POA: Insufficient documentation

## 2016-10-20 ENCOUNTER — Other Ambulatory Visit: Payer: Self-pay | Admitting: Primary Care

## 2016-10-20 DIAGNOSIS — F419 Anxiety disorder, unspecified: Principal | ICD-10-CM

## 2016-10-20 DIAGNOSIS — F32A Depression, unspecified: Secondary | ICD-10-CM

## 2016-10-20 DIAGNOSIS — F329 Major depressive disorder, single episode, unspecified: Secondary | ICD-10-CM

## 2017-01-02 ENCOUNTER — Other Ambulatory Visit: Payer: Self-pay | Admitting: Primary Care

## 2017-01-02 DIAGNOSIS — E785 Hyperlipidemia, unspecified: Secondary | ICD-10-CM

## 2017-01-02 DIAGNOSIS — I10 Essential (primary) hypertension: Secondary | ICD-10-CM

## 2017-01-10 ENCOUNTER — Other Ambulatory Visit (INDEPENDENT_AMBULATORY_CARE_PROVIDER_SITE_OTHER): Payer: Managed Care, Other (non HMO)

## 2017-01-10 DIAGNOSIS — E785 Hyperlipidemia, unspecified: Secondary | ICD-10-CM

## 2017-01-10 DIAGNOSIS — I10 Essential (primary) hypertension: Secondary | ICD-10-CM

## 2017-01-10 LAB — LIPID PANEL
CHOLESTEROL: 152 mg/dL (ref 0–200)
HDL: 47.5 mg/dL (ref >39.00)
LDL CALC: 68 mg/dL (ref 0–99)
NonHDL: 104.23
TRIGLYCERIDES: 182 mg/dL — AB (ref 0.0–149.0)
Total CHOL/HDL Ratio: 3
VLDL: 36.4 mg/dL (ref 0.0–40.0)

## 2017-01-10 LAB — COMPREHENSIVE METABOLIC PANEL
ALBUMIN: 4.1 g/dL (ref 3.5–5.2)
ALT: 16 U/L (ref 0–35)
AST: 15 U/L (ref 0–37)
Alkaline Phosphatase: 80 U/L (ref 39–117)
BILIRUBIN TOTAL: 0.4 mg/dL (ref 0.2–1.2)
BUN: 14 mg/dL (ref 6–23)
CALCIUM: 9.4 mg/dL (ref 8.4–10.5)
CO2: 31 mEq/L (ref 19–32)
CREATININE: 0.71 mg/dL (ref 0.40–1.20)
Chloride: 105 mEq/L (ref 96–112)
GFR: 89.49 mL/min (ref >60.00)
Glucose, Bld: 97 mg/dL (ref 70–99)
Potassium: 3.7 mEq/L (ref 3.5–5.1)
Sodium: 141 mEq/L (ref 135–145)
Total Protein: 6.4 g/dL (ref 6.0–8.3)

## 2017-01-10 LAB — HEMOGLOBIN A1C: Hgb A1c MFr Bld: 6 % (ref 4.6–6.5)

## 2017-01-12 ENCOUNTER — Ambulatory Visit (INDEPENDENT_AMBULATORY_CARE_PROVIDER_SITE_OTHER): Payer: Managed Care, Other (non HMO) | Admitting: Primary Care

## 2017-01-12 ENCOUNTER — Encounter: Payer: Self-pay | Admitting: Primary Care

## 2017-01-12 VITALS — BP 116/78 | HR 66 | Temp 98.4°F | Ht 61.0 in | Wt 142.8 lb

## 2017-01-12 DIAGNOSIS — K219 Gastro-esophageal reflux disease without esophagitis: Secondary | ICD-10-CM

## 2017-01-12 DIAGNOSIS — F419 Anxiety disorder, unspecified: Secondary | ICD-10-CM | POA: Diagnosis not present

## 2017-01-12 DIAGNOSIS — G2581 Restless legs syndrome: Secondary | ICD-10-CM

## 2017-01-12 DIAGNOSIS — Z0000 Encounter for general adult medical examination without abnormal findings: Secondary | ICD-10-CM

## 2017-01-12 DIAGNOSIS — F329 Major depressive disorder, single episode, unspecified: Secondary | ICD-10-CM | POA: Diagnosis not present

## 2017-01-12 DIAGNOSIS — I10 Essential (primary) hypertension: Secondary | ICD-10-CM | POA: Diagnosis not present

## 2017-01-12 DIAGNOSIS — R7303 Prediabetes: Secondary | ICD-10-CM | POA: Diagnosis not present

## 2017-01-12 DIAGNOSIS — Z23 Encounter for immunization: Secondary | ICD-10-CM

## 2017-01-12 DIAGNOSIS — E785 Hyperlipidemia, unspecified: Secondary | ICD-10-CM | POA: Diagnosis not present

## 2017-01-12 MED ORDER — VENLAFAXINE HCL ER 75 MG PO CP24: 75.0000 mg | ORAL_CAPSULE | Freq: Every day | ORAL | 0 refills | Status: DC

## 2017-01-12 NOTE — Progress Notes (Signed)
Subjective:    Patient ID: Nicole Buchanan, female    DOB: 09/08/1955, 61 y.o.   MRN: [id number]  HPI  [name] is a 61 year old female who presents today for complete physical.  Immunizations: -Tetanus: Due -Influenza: Due   Diet: She endorses a fair diet. Breakfast: Skips Lunch: Sometimes skips, sandwich, salad Dinner: Meat, vegetable, starch Snacks: Chips, cookies Desserts: 3-4 days weekly Beverages: Sweet tea, little water, rare alcohol   Exercise: She does not currently exercise.  Eye exam: Completed in November 2018 Dental exam: Completes semi-annually  Colonoscopy: Completed in 2010, due in 2020. Pap Smear: Hysterectomy  Mammogram: Completed in August 2018    Review of Systems  Constitutional: Negative for unexpected weight change.  HENT: Negative for rhinorrhea.   Respiratory: Negative for cough and shortness of breath.   Cardiovascular: Negative for chest pain.  Gastrointestinal: Negative for constipation and diarrhea.  Genitourinary: Negative for difficulty urinating and menstrual problem.  Musculoskeletal: Negative for arthralgias and myalgias.  Skin: Negative for rash.  Allergic/Immunologic: Negative for environmental allergies.  Neurological: Negative for dizziness, numbness and headaches.  Psychiatric/Behavioral:       Little motivation to do anything, feeling depressed. PHQ 9 score of 18.        Past Medical History:  Diagnosis Date  . Depression   . GERD (gastroesophageal reflux disease)   . Hiatal hernia   . Hyperlipemia   . Hypertension      Social History   Socioeconomic History  . Marital status: Married    Spouse name: Not on file  . Number of children: Not on file  . Years of education: Not on file  . Highest education level: Not on file  Social Needs  . Financial resource strain: Not on file  . Food insecurity - worry: Not on file  . Food insecurity - inability: Not on file  . Transportation needs - medical: Not on file  .  Transportation needs - non-medical: Not on file  Occupational History  . Not on file  Tobacco Use  . Smoking status: Former Smoker    Last attempt to quit: 06/27/2001    Years since quitting: 15.5  . Smokeless tobacco: Never Used  Substance and Sexual Activity  . Alcohol use: Yes  . Drug use: No  . Sexual activity: Not on file  Other Topics Concern  . Not on file  Social History Narrative   Married.   2 children. 1 grandson.   Works as a Futures traderhomemaker.   Enjoys spending time with family, gardening.    Past Surgical History:  Procedure Laterality Date  . ABDOMINAL HYSTERECTOMY    . CESAREAN SECTION    . DORSAL COMPARTMENT RELEASE  06/29/2011   Procedure: RELEASE DORSAL COMPARTMENT (DEQUERVAIN);  Surgeon: Nicki ReaperGary R Kuzma, MD;  Location: [facility];  Service: Orthopedics;  Laterality: Right;  . TONSILLECTOMY      Family History  Problem Relation Age of Onset  . Breast cancer Cousin        maternal side -50's  . COPD Father     Allergies  Allergen Reactions  . Codeine Nausea And Vomiting  . Penicillins     dizzy    Current Outpatient Medications on File Prior to Visit  Medication Sig Dispense Refill  . aspirin EC 81 MG tablet Take by mouth.    Marland Kitchen. atorvastatin (LIPITOR) 40 MG tablet Take 1 tablet (40 mg total) by mouth every evening. 90 tablet 1  .  Calcium Carb-Cholecalciferol (CALCIUM 600 + D PO) Take by mouth.    . gabapentin (NEURONTIN) 600 MG tablet Take 1 tablet (600 mg total) by mouth at bedtime. 90 tablet 1  . hydrochlorothiazide (HYDRODIURIL) 25 MG tablet Take 1 tablet (25 mg total) by mouth daily. 90 tablet 1  . lisinopril (PRINIVIL,ZESTRIL) 5 MG tablet Take 1 tablet (5 mg total) by mouth daily. 90 tablet 1  . omeprazole (PRILOSEC) 20 MG capsule Take 20 mg by mouth daily.    Marland Kitchen. venlafaxine XR (EFFEXOR-XR) 37.5 MG 24 hr capsule TAKE 1 CAPSULE BY MOUTH DAILY WITH BREAKFAST 90 capsule 0   No current facility-administered medications on file prior to visit.       BP 116/78   Pulse 66   Temp 98.4 F (36.9 C) (Oral)   Ht 5\' 1"  (1.549 m)   Wt 142 lb 12.8 oz (64.8 kg)   SpO2 96%   BMI 26.98 kg/m    Objective:   Physical Exam  Constitutional: She is oriented to person, place, and time. She appears well-nourished.  HENT:  Right Ear: Tympanic membrane and ear canal normal.  Left Ear: Tympanic membrane and ear canal normal.  Nose: Nose normal.  Mouth/Throat: Oropharynx is clear and moist.  Eyes: Conjunctivae and EOM are normal. Pupils are equal, round, and reactive to light.  Neck: Neck supple. No thyromegaly present.  Cardiovascular: Normal rate and regular rhythm.  No murmur heard. Pulmonary/Chest: Effort normal and breath sounds normal. She has no rales.  Abdominal: Soft. Bowel sounds are normal. There is no tenderness.  Musculoskeletal: Normal range of motion.  Lymphadenopathy:    She has no cervical adenopathy.  Neurological: She is alert and oriented to person, place, and time. She has normal reflexes. No cranial nerve deficit.  Skin: Skin is warm and dry. No rash noted.  Psychiatric: She has a normal mood and affect.          Assessment & Plan:

## 2017-01-12 NOTE — Assessment & Plan Note (Addendum)
Stable in the office today, continue HCTZ and lisinopril. BMP unremarkable. 

## 2017-01-12 NOTE — Addendum Note (Signed)
Addended by: Tawnya CrookSAMBATH, Fedra Lanter on: 01/12/2017 03:12 PM   Modules accepted: Orders

## 2017-01-12 NOTE — Assessment & Plan Note (Addendum)
Feeling depressed and little motivation to do anything. PHQ 9 score of 18 today. Will increase Effexor dose to 75 mg, Rx sent to pharmacy. She will update in 4 weeks.

## 2017-01-12 NOTE — Assessment & Plan Note (Signed)
A1C of 6.0 on recent labs.  Discussed to limit sweet tea, snacks, cookies. Discussed to start exercising. Repeat A1C in three months.

## 2017-01-12 NOTE — Assessment & Plan Note (Signed)
Recent lipid panel unremarkable, continue atorvastatin.

## 2017-01-12 NOTE — Assessment & Plan Note (Signed)
Td and flu due, provided today. Mammogram UTD. Colonoscopy UTD. Exam unremarkable. Labs with prediabetes, overall stable. Discussed the importance of a healthy diet and regular exercise in order for weight loss, and to reduce the risk of other medical problems. Follow up in 1 year.

## 2017-01-12 NOTE — Patient Instructions (Signed)
We increased your dose of venlafaxine (Effexor) from 37.5 mg to 75 mg. You may take two of the 37.5 mg tablets until your bottle is empty. Only take one of the 75 mg tablets when received.  Start exercising. You should be getting 150 minutes of moderate intensity exercise weekly.  It's important to improve your diet by reducing consumption of fast food, fried food, processed snack foods, sugary drinks. Increase consumption of fresh vegetables and fruits, whole grains, water.  Ensure you are drinking 64 ounces of water daily.  Schedule a lab only appointment in 3 months to recheck your sugar levels.   Follow up in 1 year for your annual exam or sooner if needed.  It was a pleasure to see you today!  Prediabetes Eating Plan Prediabetes-also called impaired glucose tolerance or impaired fasting glucose-is a condition that causes blood sugar (blood glucose) levels to be higher than normal. Following a healthy diet can help to keep prediabetes under control. It can also help to lower the risk of type 2 diabetes and heart disease, which are increased in people who have prediabetes. Along with regular exercise, a healthy diet:  Promotes weight loss.  Helps to control blood sugar levels.  Helps to improve the way that the body uses insulin.  What do I need to know about this eating plan?  Use the glycemic index (GI) to plan your meals. The index tells you how quickly a food will raise your blood sugar. Choose low-GI foods. These foods take a longer time to raise blood sugar.  Pay close attention to the amount of carbohydrates in the food that you eat. Carbohydrates increase blood sugar levels.  Keep track of how many calories you take in. Eating the right amount of calories will help you to achieve a healthy weight. Losing about 7 percent of your starting weight can help to prevent type 2 diabetes.  You may want to follow a Mediterranean diet. This diet includes a lot of vegetables, lean meats  or fish, whole grains, fruits, and healthy oils and fats. What foods can I eat? Grains Whole grains, such as whole-wheat or whole-grain breads, crackers, cereals, and pasta. Unsweetened oatmeal. Bulgur. Barley. Quinoa. Brown rice. Corn or whole-wheat flour tortillas or taco shells. Vegetables Lettuce. Spinach. Peas. Beets. Cauliflower. Cabbage. Broccoli. Carrots. Tomatoes. Squash. Eggplant. Herbs. Peppers. Onions. Cucumbers. Brussels sprouts. Fruits Berries. Bananas. Apples. Oranges. Grapes. Papaya. Mango. Pomegranate. Kiwi. Grapefruit. Cherries. Meats and Other Protein Sources Seafood. Lean meats, such as chicken and [place] or lean cuts of pork and beef. Tofu. Eggs. Nuts. Beans. Dairy Low-fat or fat-free dairy products, such as yogurt, cottage cheese, and cheese. Beverages Water. Tea. Coffee. Sugar-free or diet soda. Seltzer water. Milk. Milk alternatives, such as soy or almond milk. Condiments Mustard. Relish. Low-fat, low-sugar ketchup. Low-fat, low-sugar barbecue sauce. Low-fat or fat-free mayonnaise. Sweets and Desserts Sugar-free or low-fat pudding. Sugar-free or low-fat ice cream and other frozen treats. Fats and Oils Avocado. Walnuts. Olive oil. The items listed above may not be a complete list of recommended foods or beverages. Contact your dietitian for more options. What foods are not recommended? Grains Refined white flour and flour products, such as bread, pasta, snack foods, and cereals. Beverages Sweetened drinks, such as sweet iced tea and soda. Sweets and Desserts Baked goods, such as cake, cupcakes, pastries, cookies, and cheesecake. The items listed above may not be a complete list of foods and beverages to avoid. Contact your dietitian for more information. This information is not  intended to replace advice given to you by your health care provider. Make sure you discuss any questions you have with your health care provider. Document Released: 06/25/2014 Document  Revised: 07/17/2015 Document Reviewed: 03/06/2014 Elsevier Interactive Patient Education  2017 [employer]

## 2017-01-12 NOTE — Assessment & Plan Note (Signed)
Managed on gabapentin, overall doing well.

## 2017-01-12 NOTE — Assessment & Plan Note (Signed)
Intermittent symptoms, overall doing well on omeprazole. Continue same.

## 2017-01-13 ENCOUNTER — Other Ambulatory Visit: Payer: Self-pay | Admitting: Primary Care

## 2017-01-13 DIAGNOSIS — E785 Hyperlipidemia, unspecified: Secondary | ICD-10-CM

## 2017-01-13 DIAGNOSIS — I10 Essential (primary) hypertension: Secondary | ICD-10-CM

## 2017-01-13 DIAGNOSIS — G2581 Restless legs syndrome: Secondary | ICD-10-CM

## 2017-04-05 ENCOUNTER — Other Ambulatory Visit: Payer: Self-pay | Admitting: Primary Care

## 2017-04-05 DIAGNOSIS — F419 Anxiety disorder, unspecified: Principal | ICD-10-CM

## 2017-04-05 DIAGNOSIS — F329 Major depressive disorder, single episode, unspecified: Secondary | ICD-10-CM

## 2017-04-05 DIAGNOSIS — R7303 Prediabetes: Secondary | ICD-10-CM

## 2017-04-14 ENCOUNTER — Other Ambulatory Visit (INDEPENDENT_AMBULATORY_CARE_PROVIDER_SITE_OTHER): Payer: Managed Care, Other (non HMO)

## 2017-04-14 DIAGNOSIS — R7303 Prediabetes: Secondary | ICD-10-CM

## 2017-04-14 LAB — HEMOGLOBIN A1C: HEMOGLOBIN A1C: 6.1 % (ref 4.6–6.5)

## 2017-08-04 ENCOUNTER — Other Ambulatory Visit: Payer: Self-pay | Admitting: Primary Care

## 2017-08-04 DIAGNOSIS — I10 Essential (primary) hypertension: Secondary | ICD-10-CM

## 2017-08-04 DIAGNOSIS — E785 Hyperlipidemia, unspecified: Secondary | ICD-10-CM

## 2017-08-22 ENCOUNTER — Other Ambulatory Visit: Payer: Self-pay | Admitting: Primary Care

## 2017-08-22 DIAGNOSIS — I10 Essential (primary) hypertension: Secondary | ICD-10-CM

## 2017-08-22 DIAGNOSIS — G2581 Restless legs syndrome: Secondary | ICD-10-CM

## 2017-10-13 ENCOUNTER — Other Ambulatory Visit: Payer: Self-pay | Admitting: Primary Care

## 2017-10-13 DIAGNOSIS — F419 Anxiety disorder, unspecified: Principal | ICD-10-CM

## 2017-10-13 DIAGNOSIS — F329 Major depressive disorder, single episode, unspecified: Secondary | ICD-10-CM

## 2017-10-19 ENCOUNTER — Other Ambulatory Visit: Payer: Self-pay | Admitting: Primary Care

## 2017-10-19 DIAGNOSIS — Z1231 Encounter for screening mammogram for malignant neoplasm of breast: Secondary | ICD-10-CM

## 2017-11-02 ENCOUNTER — Ambulatory Visit
Admission: RE | Admit: 2017-11-02 | Discharge: 2017-11-02 | Disposition: A | Discharge status: Home / Self Care | Payer: Managed Care, Other (non HMO) | Source: Ambulatory Visit | Attending: Primary Care | Admitting: Primary Care

## 2017-11-02 DIAGNOSIS — Z1231 Encounter for screening mammogram for malignant neoplasm of breast: Secondary | ICD-10-CM | POA: Insufficient documentation

## 2017-11-16 ENCOUNTER — Other Ambulatory Visit: Payer: Self-pay | Admitting: Primary Care

## 2017-11-16 DIAGNOSIS — G2581 Restless legs syndrome: Secondary | ICD-10-CM

## 2017-12-13 ENCOUNTER — Other Ambulatory Visit: Payer: Self-pay | Admitting: Primary Care

## 2017-12-13 DIAGNOSIS — I10 Essential (primary) hypertension: Secondary | ICD-10-CM

## 2018-02-05 ENCOUNTER — Other Ambulatory Visit: Payer: Self-pay | Admitting: Primary Care

## 2018-02-05 DIAGNOSIS — G2581 Restless legs syndrome: Secondary | ICD-10-CM

## 2018-02-05 DIAGNOSIS — I10 Essential (primary) hypertension: Secondary | ICD-10-CM

## 2018-02-05 DIAGNOSIS — E785 Hyperlipidemia, unspecified: Secondary | ICD-10-CM

## 2018-03-30 ENCOUNTER — Other Ambulatory Visit: Payer: Self-pay | Admitting: Primary Care

## 2018-03-30 DIAGNOSIS — F419 Anxiety disorder, unspecified: Principal | ICD-10-CM

## 2018-03-30 DIAGNOSIS — I10 Essential (primary) hypertension: Secondary | ICD-10-CM

## 2018-03-30 DIAGNOSIS — F32A Depression, unspecified: Secondary | ICD-10-CM

## 2018-03-30 DIAGNOSIS — F329 Major depressive disorder, single episode, unspecified: Secondary | ICD-10-CM

## 2018-04-03 DIAGNOSIS — F329 Major depressive disorder, single episode, unspecified: Secondary | ICD-10-CM

## 2018-04-03 DIAGNOSIS — F419 Anxiety disorder, unspecified: Secondary | ICD-10-CM

## 2018-04-03 DIAGNOSIS — I10 Essential (primary) hypertension: Secondary | ICD-10-CM

## 2018-04-03 DIAGNOSIS — F32A Depression, unspecified: Secondary | ICD-10-CM

## 2018-04-04 MED ORDER — LISINOPRIL 5 MG PO TABS: 5.0000 mg | ORAL_TABLET | Freq: Every day | ORAL | 0 refills | Status: DC

## 2018-04-04 MED ORDER — VENLAFAXINE HCL ER 75 MG PO CP24: ORAL_CAPSULE | ORAL | 0 refills | Status: DC

## 2018-04-04 NOTE — Telephone Encounter (Signed)
Refill send and patient has been notified.

## 2018-04-04 NOTE — Telephone Encounter (Signed)
Pt called and scheduled physical for next Tues 2/18 and is out of lisinipril and ven

## 2018-04-04 NOTE — Telephone Encounter (Signed)
Pt is out of lisinopril & venlasixine and has scheduled appt for 04/11/18. Please send to [facility] [address]

## 2018-04-05 ENCOUNTER — Other Ambulatory Visit: Payer: Self-pay | Admitting: Primary Care

## 2018-04-05 DIAGNOSIS — Z1159 Encounter for screening for other viral diseases: Secondary | ICD-10-CM

## 2018-04-05 DIAGNOSIS — I10 Essential (primary) hypertension: Secondary | ICD-10-CM

## 2018-04-05 DIAGNOSIS — E785 Hyperlipidemia, unspecified: Secondary | ICD-10-CM

## 2018-04-05 DIAGNOSIS — R7303 Prediabetes: Secondary | ICD-10-CM

## 2018-04-07 ENCOUNTER — Other Ambulatory Visit (INDEPENDENT_AMBULATORY_CARE_PROVIDER_SITE_OTHER): Payer: Managed Care, Other (non HMO)

## 2018-04-07 DIAGNOSIS — Z1159 Encounter for screening for other viral diseases: Secondary | ICD-10-CM

## 2018-04-07 DIAGNOSIS — I10 Essential (primary) hypertension: Secondary | ICD-10-CM

## 2018-04-07 DIAGNOSIS — E785 Hyperlipidemia, unspecified: Secondary | ICD-10-CM | POA: Diagnosis not present

## 2018-04-07 DIAGNOSIS — R7303 Prediabetes: Secondary | ICD-10-CM

## 2018-04-07 LAB — COMPREHENSIVE METABOLIC PANEL
ALT: 20 U/L (ref 0–35)
AST: 16 U/L (ref 0–37)
Albumin: 4.2 g/dL (ref 3.5–5.2)
Alkaline Phosphatase: 87 U/L (ref 39–117)
BUN: 24 mg/dL — ABNORMAL HIGH (ref 6–23)
CO2: 29 mEq/L (ref 19–32)
CREATININE: 0.74 mg/dL (ref 0.40–1.20)
Calcium: 9.6 mg/dL (ref 8.4–10.5)
Chloride: 104 mEq/L (ref 96–112)
GFR: 79.93 mL/min (ref >60.00)
Glucose, Bld: 96 mg/dL (ref 70–99)
Potassium: 4 mEq/L (ref 3.5–5.1)
SODIUM: 141 meq/L (ref 135–145)
Total Bilirubin: 0.4 mg/dL (ref 0.2–1.2)
Total Protein: 6.5 g/dL (ref 6.0–8.3)

## 2018-04-07 LAB — LIPID PANEL
Cholesterol: 165 mg/dL (ref 0–200)
HDL: 51.1 mg/dL (ref >39.00)
LDL Cholesterol: 80 mg/dL (ref 0–99)
NonHDL: 114.2
Total CHOL/HDL Ratio: 3
Triglycerides: 172 mg/dL — ABNORMAL HIGH (ref 0.0–149.0)
VLDL: 34.4 mg/dL (ref 0.0–40.0)

## 2018-04-07 LAB — HEMOGLOBIN A1C: Hgb A1c MFr Bld: 6 % (ref 4.6–6.5)

## 2018-04-08 LAB — HEPATITIS C ANTIBODY
Hepatitis C Ab: NONREACTIVE
SIGNAL TO CUT-OFF: 0 (ref <1.00)

## 2018-04-11 ENCOUNTER — Ambulatory Visit (INDEPENDENT_AMBULATORY_CARE_PROVIDER_SITE_OTHER): Payer: Managed Care, Other (non HMO) | Admitting: Primary Care

## 2018-04-11 ENCOUNTER — Encounter: Payer: Self-pay | Admitting: Primary Care

## 2018-04-11 VITALS — BP 126/86 | HR 67 | Temp 98.0°F | Ht 61.0 in | Wt 143.0 lb

## 2018-04-11 DIAGNOSIS — E785 Hyperlipidemia, unspecified: Secondary | ICD-10-CM | POA: Diagnosis not present

## 2018-04-11 DIAGNOSIS — Z23 Encounter for immunization: Secondary | ICD-10-CM | POA: Diagnosis not present

## 2018-04-11 DIAGNOSIS — F329 Major depressive disorder, single episode, unspecified: Secondary | ICD-10-CM

## 2018-04-11 DIAGNOSIS — F419 Anxiety disorder, unspecified: Secondary | ICD-10-CM

## 2018-04-11 DIAGNOSIS — Z0000 Encounter for general adult medical examination without abnormal findings: Secondary | ICD-10-CM | POA: Diagnosis not present

## 2018-04-11 DIAGNOSIS — G2581 Restless legs syndrome: Secondary | ICD-10-CM

## 2018-04-11 DIAGNOSIS — R7303 Prediabetes: Secondary | ICD-10-CM

## 2018-04-11 DIAGNOSIS — I10 Essential (primary) hypertension: Secondary | ICD-10-CM

## 2018-04-11 DIAGNOSIS — K219 Gastro-esophageal reflux disease without esophagitis: Secondary | ICD-10-CM

## 2018-04-11 DIAGNOSIS — Z1211 Encounter for screening for malignant neoplasm of colon: Secondary | ICD-10-CM

## 2018-04-11 MED ORDER — GABAPENTIN 600 MG PO TABS: 600.0000 mg | ORAL_TABLET | Freq: Every day | ORAL | 3 refills | Status: DC

## 2018-04-11 MED ORDER — ATORVASTATIN CALCIUM 40 MG PO TABS: 40.0000 mg | ORAL_TABLET | Freq: Every evening | ORAL | 3 refills | Status: DC

## 2018-04-11 MED ORDER — LISINOPRIL 5 MG PO TABS: 5.0000 mg | ORAL_TABLET | Freq: Every day | ORAL | 3 refills | Status: DC

## 2018-04-11 MED ORDER — VENLAFAXINE HCL ER 75 MG PO CP24: ORAL_CAPSULE | ORAL | 3 refills | Status: DC

## 2018-04-11 MED ORDER — HYDROCHLOROTHIAZIDE 25 MG PO TABS: 25.0000 mg | ORAL_TABLET | Freq: Every day | ORAL | 3 refills | Status: DC

## 2018-04-11 NOTE — Assessment & Plan Note (Signed)
Doing well on gabapentin 600 mg HS, continue same. Refill sent to pharmacy.

## 2018-04-11 NOTE — Assessment & Plan Note (Signed)
Stable in the office today, continue atorvastatin. Refills sent to pharmacy.

## 2018-04-11 NOTE — Assessment & Plan Note (Signed)
Recent A1C stable, continue to monitor. Discussed to limit sweet tea, sweets, starchy/processed food. Continue to monitor annually.

## 2018-04-11 NOTE — Assessment & Plan Note (Signed)
Tetanus UTD, influenza vaccination provided today. She will receive Shingrix through our clinic. Colonoscopy due, referral placed to GI. Mammogram due in September 2020. Recommended regular exercise, work on diet. Exam unremarkable. Labs reviewed. Follow up in 1 year for CPE.

## 2018-04-11 NOTE — Patient Instructions (Addendum)
Start exercising. You should be getting 150 minutes of moderate intensity exercise weekly.  It's important to improve your diet by reducing consumption of fast food, fried food, processed snack foods, sugary drinks. Increase consumption of fresh vegetables and fruits, whole grains, water.  Ensure you are drinking 64 ounces of water daily.  You will be contacted regarding your referral to GI for the colonoscopy.  Please let us know if you have not been contacted within one week.   Leafy Ro will be calling you to get set up for the Shingles vaccinations.   We will see you next year or sooner if needed. It was a pleasure to see you today!   Preventive Care 40-64 Years, Female Preventive care refers to lifestyle choices and visits with your health care provider that can promote health and wellness. What does preventive care include?   A yearly physical exam. This is also called an annual well check.  Dental exams once or twice a year.  Routine eye exams. Ask your health care provider how often you should have your eyes checked.  Personal lifestyle choices, including: ? Daily care of your teeth and gums. ? Regular physical activity. ? Eating a healthy diet. ? Avoiding tobacco and drug use. ? Limiting alcohol use. ? Practicing safe sex. ? Taking low-dose aspirin daily starting at age 66. ? Taking vitamin and mineral supplements as recommended by your health care provider. What happens during an annual well check? The services and screenings done by your health care provider during your annual well check will depend on your age, overall health, lifestyle risk factors, and family history of disease. Counseling Your health care provider may ask you questions about your:  Alcohol use.  Tobacco use.  Drug use.  Emotional well-being.  Home and relationship well-being.  Sexual activity.  Eating habits.  Work and work Statistician.  Method of birth control.  Menstrual  cycle.  Pregnancy history. Screening You may have the following tests or measurements:  Height, weight, and BMI.  Blood pressure.  Lipid and cholesterol levels. These may be checked every 5 years, or more frequently if you are over 12 years old.  Skin check.  Lung cancer screening. You may have this screening every year starting at age 20 if you have a 30-pack-year history of smoking and currently smoke or have quit within the past 15 years.  Colorectal cancer screening. All adults should have this screening starting at age 49 and continuing until age 60. Your health care provider may recommend screening at age 13. You will have tests every 1-10 years, depending on your results and the type of screening test. People at increased risk should start screening at an earlier age. Screening tests may include: ? Guaiac-based fecal occult blood testing. ? Fecal immunochemical test (FIT). ? Stool DNA test. ? Virtual colonoscopy. ? Sigmoidoscopy. During this test, a flexible tube with a tiny camera (sigmoidoscope) is used to examine your rectum and lower colon. The sigmoidoscope is inserted through your anus into your rectum and lower colon. ? Colonoscopy. During this test, a long, thin, flexible tube with a tiny camera (colonoscope) is used to examine your entire colon and rectum.  Hepatitis C blood test.  Hepatitis B blood test.  Sexually transmitted disease (STD) testing.  Diabetes screening. This is done by checking your blood sugar (glucose) after you have not eaten for a while (fasting). You may have this done every 1-3 years.  Mammogram. This may be done every 1-2 years. Talk  to your health care provider about when you should start having regular mammograms. This may depend on whether you have a family history of breast cancer.  BRCA-related cancer screening. This may be done if you have a family history of breast, ovarian, tubal, or peritoneal cancers.  Pelvic exam and Pap test.  This may be done every 3 years starting at age 67. Starting at age 25, this may be done every 5 years if you have a Pap test in combination with an HPV test.  Bone density scan. This is done to screen for osteoporosis. You may have this scan if you are at high risk for osteoporosis. Discuss your test results, treatment options, and if necessary, the need for more tests with your health care provider. Vaccines Your health care provider may recommend certain vaccines, such as:  Influenza vaccine. This is recommended every year.  Tetanus, diphtheria, and acellular pertussis (Tdap, Td) vaccine. You may need a Td booster every 10 years.  Varicella vaccine. You may need this if you have not been vaccinated.  Zoster vaccine. You may need this after age 3.  Measles, mumps, and rubella (MMR) vaccine. You may need at least one dose of MMR if you were born in 1957 or later. You may also need a second dose.  Pneumococcal 13-valent conjugate (PCV13) vaccine. You may need this if you have certain conditions and were not previously vaccinated.  Pneumococcal polysaccharide (PPSV23) vaccine. You may need one or two doses if you smoke cigarettes or if you have certain conditions.  Meningococcal vaccine. You may need this if you have certain conditions.  Hepatitis A vaccine. You may need this if you have certain conditions or if you travel or work in places where you may be exposed to hepatitis A.  Hepatitis B vaccine. You may need this if you have certain conditions or if you travel or work in places where you may be exposed to hepatitis B.  Haemophilus influenzae type b (Hib) vaccine. You may need this if you have certain conditions. Talk to your health care provider about which screenings and vaccines you need and how often you need them. This information is not intended to replace advice given to you by your health care provider. Make sure you discuss any questions you have with your health care  provider. Document Released: 03/07/2015 Document Revised: 03/31/2017 Document Reviewed: 12/10/2014 Elsevier Interactive Patient Education  2019 [employer].

## 2018-04-11 NOTE — Assessment & Plan Note (Signed)
Stable in the office today, continue lisinopril and HCTZ. Refill sent to pharmacy. BMP unremarkable.

## 2018-04-11 NOTE — Addendum Note (Signed)
Addended by: Tawnya Crook on: 04/11/2018 12:13 PM   Modules accepted: Orders

## 2018-04-11 NOTE — Assessment & Plan Note (Signed)
Overall doing well on venlafaxine 75 mg daily. Continue same. Declines therapy.

## 2018-04-11 NOTE — Assessment & Plan Note (Signed)
Doing well on omeprazole 20 mg, continue same. 

## 2018-04-11 NOTE — Progress Notes (Signed)
Subjective:    Patient ID: Nicole Buchanan, female    DOB: Aug 18, 1955, 63 y.o.   MRN: [id number]  HPI  Nicole Buchanan is a 63 year old female who presents today for complete physical.  Immunizations: -Tetanus: Completed in 2018 -Influenza: Due today -Shingles: Never completed    Diet: She endorses a fair diet Breakfast: Fiber one bar, oatmeal/grits Lunch: Sandwich Dinner: Meat, vegetable, starch Snacks: Chocolate  Desserts: 5 days weekly  Beverages: Sweet tea, coffee, little water, occasional alcohol   Exercise: She is not exercising Eye exam: Completed several years ago  Dental exam: Completes semi-annually  Colonoscopy: Completed 10 years ago, due Pap Smear: Hysterectomy  Mammogram: Completed in September 2019 Hep C Screen: Negative in 2020  BP Readings from Last 3 Encounters:  04/11/18 126/86  01/12/17 116/78  09/03/16 124/84     Review of Systems  Constitutional: Negative for unexpected weight change.  HENT: Negative for rhinorrhea.   Respiratory: Negative for cough and shortness of breath.   Cardiovascular: Negative for chest pain.  Gastrointestinal: Negative for constipation and diarrhea.  Genitourinary: Negative for difficulty urinating.  Musculoskeletal: Negative for arthralgias.  Skin: Negative for rash.  Allergic/Immunologic: Negative for environmental allergies.  Neurological: Negative for dizziness and numbness.       Occasional headaches  Psychiatric/Behavioral: Negative for suicidal ideas.       Overall feels well managed on Effexor       Past Medical History:  Diagnosis Date  . Depression   . GERD (gastroesophageal reflux disease)   . Hiatal hernia   . Hyperlipemia   . Hypertension      Social History   Socioeconomic History  . Marital status: Married    Spouse name: Not on file  . Number of children: Not on file  . Years of education: Not on file  . Highest education level: Not on file  Occupational History  . Not on file  Social  Needs  . Financial resource strain: Not on file  . Food insecurity:    Worry: Not on file    Inability: Not on file  . Transportation needs:    Medical: Not on file    Non-medical: Not on file  Tobacco Use  . Smoking status: Former Smoker    Last attempt to quit: 06/27/2001    Years since quitting: 16.8  . Smokeless tobacco: Never Used  Substance and Sexual Activity  . Alcohol use: Yes  . Drug use: No  . Sexual activity: Not on file  Lifestyle  . Physical activity:    Days per week: Not on file    Minutes per session: Not on file  . Stress: Not on file  Relationships  . Social connections:    Talks on phone: Not on file    Gets together: Not on file    Attends religious service: Not on file    Active member of club or organization: Not on file    Attends meetings of clubs or organizations: Not on file    Relationship status: Not on file  . Intimate partner violence:    Fear of current or ex partner: Not on file    Emotionally abused: Not on file    Physically abused: Not on file    Forced sexual activity: Not on file  Other Topics Concern  . Not on file  Social History Narrative   Married.   2 children. 1 grandson.   Works as a Futures trader.   Enjoys spending  time with family, gardening.    Past Surgical History:  Procedure Laterality Date  . ABDOMINAL HYSTERECTOMY    . CESAREAN SECTION    . DORSAL COMPARTMENT RELEASE  06/29/2011   Procedure: RELEASE DORSAL COMPARTMENT (DEQUERVAIN);  Surgeon: Nicki Reaper, MD;  Location: [facility];  Service: Orthopedics;  Laterality: Right;  . TONSILLECTOMY      Family History  Problem Relation Age of Onset  . Breast cancer Cousin        maternal side -50's  . COPD Father     Allergies  Allergen Reactions  . Codeine Nausea And Vomiting  . Penicillins     dizzy    Current Outpatient Medications on File Prior to Visit  Medication Sig Dispense Refill  . aspirin EC 81 MG tablet Take by mouth.    . Calcium  Carb-Cholecalciferol (CALCIUM 600 + D PO) Take by mouth.    Marland Kitchen omeprazole (PRILOSEC) 20 MG capsule Take 20 mg by mouth daily.     No current facility-administered medications on file prior to visit.     BP 126/86   Pulse 67   Temp 98 F (36.7 C) (Oral)   Ht 5\' 1"  (1.549 m)   Wt 143 lb (64.9 kg)   SpO2 98%   BMI 27.02 kg/m    Objective:   Physical Exam  Constitutional: She is oriented to person, place, and time. She appears well-nourished.  HENT:  Mouth/Throat: No oropharyngeal exudate.  Eyes: Pupils are equal, round, and reactive to light. EOM are normal.  Neck: Neck supple. No thyromegaly present.  Cardiovascular: Normal rate and regular rhythm.  Respiratory: Effort normal and breath sounds normal.  GI: Soft. Bowel sounds are normal. There is no abdominal tenderness.  Musculoskeletal: Normal range of motion.  Neurological: She is alert and oriented to person, place, and time.  Skin: Skin is warm and dry.  Psychiatric: She has a normal mood and affect.           Assessment & Plan:

## 2018-04-19 ENCOUNTER — Telehealth: Payer: Self-pay | Admitting: Gastroenterology

## 2018-04-19 NOTE — Telephone Encounter (Signed)
PATIENT RETURNING CALL TO SCHEDULE COLONOSCOPY

## 2018-04-19 NOTE — Telephone Encounter (Signed)
Returned patients call to schedule colonoscopy.  LVM for her to call me back.  Thanks Ellene Bloodsaw 

## 2018-04-20 ENCOUNTER — Other Ambulatory Visit: Payer: Self-pay

## 2018-04-20 DIAGNOSIS — Z1211 Encounter for screening for malignant neoplasm of colon: Secondary | ICD-10-CM

## 2018-04-20 MED ORDER — PEG 3350-KCL-NA BICARB-NACL 420 G PO SOLR: 4000.0000 mL | Freq: Once | ORAL | 0 refills | Status: AC

## 2018-05-02 ENCOUNTER — Telehealth: Payer: Self-pay | Admitting: Primary Care

## 2018-05-02 NOTE — Telephone Encounter (Signed)
lvm asking pt to call office to schedule Shingrix. Needs to be on a Tuesday afternoon.

## 2018-05-03 ENCOUNTER — Ambulatory Visit
Admission: RE | Admit: 2018-05-03 | Discharge: 2018-05-03 | Disposition: A | Discharge status: Home / Self Care | Payer: 59 | Attending: Gastroenterology | Admitting: Gastroenterology

## 2018-05-03 ENCOUNTER — Encounter: Payer: Self-pay | Admitting: *Deleted

## 2018-05-03 ENCOUNTER — Encounter
Admission: RE | Disposition: A | Discharge status: Home / Self Care | Payer: Self-pay | Source: Home / Self Care | Attending: Gastroenterology

## 2018-05-03 ENCOUNTER — Other Ambulatory Visit: Payer: Self-pay

## 2018-05-03 ENCOUNTER — Ambulatory Visit: Payer: 59 | Admitting: Certified Registered"

## 2018-05-03 DIAGNOSIS — Z87891 Personal history of nicotine dependence: Secondary | ICD-10-CM | POA: Insufficient documentation

## 2018-05-03 DIAGNOSIS — Z1211 Encounter for screening for malignant neoplasm of colon: Secondary | ICD-10-CM | POA: Insufficient documentation

## 2018-05-03 DIAGNOSIS — K219 Gastro-esophageal reflux disease without esophagitis: Secondary | ICD-10-CM | POA: Diagnosis not present

## 2018-05-03 DIAGNOSIS — Z7982 Long term (current) use of aspirin: Secondary | ICD-10-CM | POA: Diagnosis not present

## 2018-05-03 DIAGNOSIS — Z79899 Other long term (current) drug therapy: Secondary | ICD-10-CM | POA: Diagnosis not present

## 2018-05-03 DIAGNOSIS — F329 Major depressive disorder, single episode, unspecified: Secondary | ICD-10-CM | POA: Diagnosis not present

## 2018-05-03 DIAGNOSIS — E785 Hyperlipidemia, unspecified: Secondary | ICD-10-CM | POA: Insufficient documentation

## 2018-05-03 DIAGNOSIS — I10 Essential (primary) hypertension: Secondary | ICD-10-CM | POA: Insufficient documentation

## 2018-05-03 HISTORY — PX: COLONOSCOPY WITH PROPOFOL: SHX5780

## 2018-05-03 SURGERY — COLONOSCOPY WITH PROPOFOL
Anesthesia: General

## 2018-05-03 MED ORDER — SODIUM CHLORIDE 0.9 % IV SOLN
INTRAVENOUS | Status: DC
  Administered 2018-05-03: 10:00:00 via INTRAVENOUS

## 2018-05-03 MED ORDER — PROPOFOL 10 MG/ML IV BOLUS
INTRAVENOUS | Status: DC | PRN
  Administered 2018-05-03 (×2): 20 mg via INTRAVENOUS
  Administered 2018-05-03: 50 mg via INTRAVENOUS
  Administered 2018-05-03 (×4): 20 mg via INTRAVENOUS
  Administered 2018-05-03: 50 mg via INTRAVENOUS
  Administered 2018-05-03 (×3): 20 mg via INTRAVENOUS

## 2018-05-03 MED ORDER — PHENYLEPHRINE HCL 10 MG/ML IJ SOLN
INTRAMUSCULAR | Status: DC | PRN
  Administered 2018-05-03 (×2): 100 ug via INTRAVENOUS

## 2018-05-03 MED ORDER — EPHEDRINE SULFATE 50 MG/ML IJ SOLN
INTRAMUSCULAR | Status: DC | PRN
  Administered 2018-05-03: 10 mg via INTRAVENOUS

## 2018-05-03 NOTE — Anesthesia Post-op Follow-up Note (Signed)
Anesthesia QCDR form completed.        

## 2018-05-03 NOTE — OR Nursing (Signed)
Waiting on Dr. Hoy Finlay to speak with pt.

## 2018-05-03 NOTE — Op Note (Signed)
[facility]astroenterology Patient Name: Nicole Buchanan Procedure Date: 05/03/2018 10:21 AM MRN: [id number] Account #: [id number] Date of Birth: [birth year] Admit Type: Outpatient Age: 63 Room: [facility] ENDO ROOM 4 Gender: Female Note Status: Finalized Procedure:            Colonoscopy Indications:          Screening for colorectal malignant neoplasm Providers:            Estus Krakowski B. Maximino Greenland MD, MD Referring MD:         Doreene Nest (Referring MD) Medicines:            Monitored Anesthesia Care Complications:        No immediate complications. Procedure:            Pre-Anesthesia Assessment:                       - Prior to the procedure, a History and Physical was                        performed, and patient medications, allergies and                        sensitivities were reviewed. The patient's tolerance of                        previous anesthesia was reviewed.                       - The risks and benefits of the procedure and the                        sedation options and risks were discussed with the                        patient. All questions were answered and informed                        consent was obtained.                       - Patient identification and proposed procedure were                        verified prior to the procedure by the physician, the                        nurse, the anesthetist and the technician. The                        procedure was verified in the pre-procedure area in the                        procedure room in the endoscopy suite.                       - ASA Grade Assessment: II - A patient with mild                        systemic disease.                       -  After reviewing the risks and benefits, the patient                        was deemed in satisfactory condition to undergo the                        procedure.                       After obtaining informed consent, the colonoscope was               passed under direct vision. Throughout the procedure,                        the patient's blood pressure, pulse, and oxygen                        saturations were monitored continuously. The                        Colonoscope was introduced through the anus and                        advanced to the the cecum, identified by appendiceal                        orifice and ileocecal valve. The colonoscopy was                        performed with ease. The patient tolerated the                        procedure well. The quality of the bowel preparation                        was good. Findings:      The perianal and digital rectal examinations were normal.      The rectum, sigmoid colon, descending colon, transverse colon, ascending       colon and cecum appeared normal.      Anal papilla(e) were hypertrophied.      The retroflexed view of the distal rectum and anal verge was normal and       showed no anal or rectal abnormalities. Impression:           - The rectum, sigmoid colon, descending colon,                        transverse colon, ascending colon and cecum are normal.                       - Anal papilla(e) were hypertrophied.                       - The distal rectum and anal verge are normal on                        retroflexion view.                       - No specimens collected. Recommendation:       - Discharge patient to home.                       -  Resume previous diet.                       - Continue present medications.                       - Repeat colonoscopy in 10 years for screening purposes.                       - Return to primary care physician as previously                        scheduled.                       - The findings and recommendations were discussed with                        the patient.                       - The findings and recommendations were discussed with                        the patient's family.                        - In the future, if patient develops new symptoms such                        as blood per rectum, abdominal pain, weight loss,                        altered bowel habits or any other reason for concern,                        patient should discuss this with thier PCP as they may                        need a GI referral at that time or evaluation for need                        for colonoscopy earlier than the recommended screening                        colonoscopy.                       In addition, if patient's family history of colon                        cancer changes (no family history at this time) in the                        future, earlier screening may be indicated and patient                        should discuss this with PCP as well. Procedure Code(s):    --- Professional ---                       [phone], Colonoscopy, flexible; diagnostic,  including                        collection of specimen(s) by brushing or washing, when                        performed (separate procedure) Diagnosis Code(s):    --- Professional ---                       Z12.11, Encounter for screening for malignant neoplasm                        of colon CPT copyright 2018 American Medical Association. All rights reserved. The codes documented in this report are preliminary and upon coder review may  be revised to meet current compliance requirements.  Melodie Bouillon, MD Michel Bickers B. Maximino Greenland MD, MD 05/03/2018 10:52:23 AM This report has been signed electronically. Number of Addenda: 0 Note Initiated On: 05/03/2018 10:21 AM Scope Withdrawal Time: 0 hours 14 minutes 44 seconds  Total Procedure Duration: 0 hours 22 minutes 34 seconds       [facility], [place]

## 2018-05-03 NOTE — Anesthesia Postprocedure Evaluation (Signed)
Anesthesia Post Note  Patient: Nicole Buchanan  Procedure(s) Performed: COLONOSCOPY WITH PROPOFOL (N/A )  Patient location during evaluation: Endoscopy Anesthesia Type: General Level of consciousness: awake and alert and oriented Pain management: pain level controlled Vital Signs Assessment: post-procedure vital signs reviewed and stable Respiratory status: spontaneous breathing, nonlabored ventilation and respiratory function stable Cardiovascular status: blood pressure returned to baseline and stable Postop Assessment: no signs of nausea or vomiting Anesthetic complications: no     Last Vitals:  Vitals:   05/03/18 1111 05/03/18 1121  BP: (!) 98/59 102/66  Pulse: 72 68  Resp: 19 19  Temp:    SpO2: 100% 100%    Last Pain:  Vitals:   05/03/18 1121  TempSrc:   PainSc: 0-No pain                 Miachel Nardelli

## 2018-05-03 NOTE — Anesthesia Preprocedure Evaluation (Signed)
Anesthesia Evaluation  Patient identified by MRN, date of birth, ID band Patient awake    Reviewed: Allergy & Precautions, NPO status , Patient's Chart, lab work & pertinent test results  History of Anesthesia Complications Negative for: history of anesthetic complications  Airway Mallampati: II  TM Distance: >3 FB Neck ROM: Full    Dental no notable dental hx.    Pulmonary neg sleep apnea, neg COPD, former smoker,    breath sounds clear to auscultation- rhonchi (-) wheezing      Cardiovascular Exercise Tolerance: Good hypertension, Pt. on medications (-) CAD, (-) Past MI, (-) Cardiac Stents and (-) CABG  Rhythm:Regular Rate:Normal - Systolic murmurs and - Diastolic murmurs    Neuro/Psych neg Seizures PSYCHIATRIC DISORDERS Anxiety Depression negative neurological ROS     GI/Hepatic Neg liver ROS, hiatal hernia, GERD  ,  Endo/Other  negative endocrine ROSneg diabetes  Renal/GU negative Renal ROS     Musculoskeletal negative musculoskeletal ROS (+)   Abdominal (+) - obese,   Peds  Hematology negative hematology ROS (+)   Anesthesia Other Findings Past Medical History: No date: Depression No date: GERD (gastroesophageal reflux disease) No date: Hiatal hernia No date: Hyperlipemia No date: Hypertension   Reproductive/Obstetrics                             Anesthesia Physical Anesthesia Plan  ASA: II  Anesthesia Plan: General   Post-op Pain Management:    Induction: Intravenous  PONV Risk Score and Plan: 2 and Propofol infusion  Airway Management Planned: Natural Airway  Additional Equipment:   Intra-op Plan:   Post-operative Plan:   Informed Consent: I have reviewed the patients History and Physical, chart, labs and discussed the procedure including the risks, benefits and alternatives for the proposed anesthesia with the patient or authorized representative who has  indicated his/her understanding and acceptance.     Dental advisory given  Plan Discussed with: CRNA and Anesthesiologist  Anesthesia Plan Comments:         Anesthesia Quick Evaluation

## 2018-05-03 NOTE — H&P (Signed)
Nicole Bouillon, MD [address], [place], [place], [address] [address], [place], [place], [address] Phone: [phone]  Fax: [phone]  Primary Care Physician:  Doreene Nest, NP   Pre-Procedure History & Physical: HPI:  Nicole Buchanan is a 63 y.o. female is here for a colonoscopy.   Past Medical History:  Diagnosis Date  . Depression   . GERD (gastroesophageal reflux disease)   . Hiatal hernia   . Hyperlipemia   . Hypertension     Past Surgical History:  Procedure Laterality Date  . ABDOMINAL HYSTERECTOMY    . CESAREAN SECTION    . DORSAL COMPARTMENT RELEASE  06/29/2011   Procedure: RELEASE DORSAL COMPARTMENT (DEQUERVAIN);  Surgeon: Nicki Reaper, MD;  Location: [facility];  Service: Orthopedics;  Laterality: Right;  . TONSILLECTOMY      Prior to Admission medications   Medication Sig Start Date End Date Taking? Authorizing Provider  gabapentin (NEURONTIN) 600 MG tablet Take 1 tablet (600 mg total) by mouth at bedtime. For restless legs. 04/11/18  Yes Doreene Nest, NP  hydrochlorothiazide (HYDRODIURIL) 25 MG tablet Take 1 tablet (25 mg total) by mouth daily. For blood pressure. 04/11/18  Yes Doreene Nest, NP  lisinopril (PRINIVIL,ZESTRIL) 5 MG tablet Take 1 tablet (5 mg total) by mouth daily. For blood pressure. 04/11/18  Yes Doreene Nest, NP  omeprazole (PRILOSEC) 20 MG capsule Take 20 mg by mouth daily.   Yes [provider]  venlafaxine XR (EFFEXOR-XR) 75 MG 24 hr capsule TAKE 1 CAPSULE BY MOUTH DAILY WITH BREAKFAST anxiety and depression. 04/11/18  Yes Doreene Nest, NP  aspirin EC 81 MG tablet Take by mouth.    [provider]  atorvastatin (LIPITOR) 40 MG tablet Take 1 tablet (40 mg total) by mouth every evening. For cholesterol. 04/11/18   Doreene Nest, NP  Calcium Carb-Cholecalciferol (CALCIUM 600 + D PO) Take by mouth.    [provider]    Allergies as of  04/20/2018 - Review Complete 04/11/2018  Allergen Reaction Noted  . Codeine Nausea And Vomiting 06/28/2011  . Penicillins  06/28/2011    Family History  Problem Relation Age of Onset  . Breast cancer Cousin        maternal side -50's  . COPD Father     Social History   Socioeconomic History  . Marital status: Married    Spouse name: Not on file  . Number of children: Not on file  . Years of education: Not on file  . Highest education level: Not on file  Occupational History  . Not on file  Social Needs  . Financial resource strain: Not on file  . Food insecurity:    Worry: Not on file    Inability: Not on file  . Transportation needs:    Medical: Not on file    Non-medical: Not on file  Tobacco Use  . Smoking status: Former Smoker    Last attempt to quit: 06/27/2001    Years since quitting: 16.8  . Smokeless tobacco: Never Used  Substance and Sexual Activity  . Alcohol use: Yes  . Drug use: No  . Sexual activity: Not on file  Lifestyle  . Physical activity:    Days per week: Not on file    Minutes per session: Not on file  . Stress: Not on file  Relationships  . Social connections:    Talks on phone: Not on file  Gets together: Not on file    Attends religious service: Not on file    Active member of club or organization: Not on file    Attends meetings of clubs or organizations: Not on file    Relationship status: Not on file  . Intimate partner violence:    Fear of current or ex partner: Not on file    Emotionally abused: Not on file    Physically abused: Not on file    Forced sexual activity: Not on file  Other Topics Concern  . Not on file  Social History Narrative   Married.   2 children. 1 grandson.   Works as a Futures trader.   Enjoys spending time with family, gardening.    Review of Systems: See HPI, otherwise negative ROS  Physical Exam: BP 126/80   Pulse 76   Temp 98.4 F (36.9 C) (Tympanic)   Resp 16   Ht 5\' 1"  (1.549 m)   Wt 65.3  kg   SpO2 100%   BMI 27.21 kg/m  General:   Alert,  pleasant and cooperative in NAD Head:  Normocephalic and atraumatic. Neck:  Supple; no masses or thyromegaly. Lungs:  Clear throughout to auscultation, normal respiratory effort.    Heart:  +S1, +S2, Regular rate and rhythm, No edema. Abdomen:  Soft, nontender and nondistended. Normal bowel sounds, without guarding, and without rebound.   Neurologic:  Alert and  oriented x4;  grossly normal neurologically.  Impression/Plan: [name] is here for a colonoscopy to be performed for average risk screening.  Risks, benefits, limitations, and alternatives regarding  colonoscopy have been reviewed with the patient.  Questions have been answered.  All parties agreeable.   Pasty Spillers, MD  05/03/2018, 10:08 AM

## 2018-05-03 NOTE — Transfer of Care (Signed)
Immediate Anesthesia Transfer of Care Note  Patient: Nicole Buchanan  Procedure(s) Performed: COLONOSCOPY WITH PROPOFOL (N/A )  Patient Location: Endo    Anesthesia Type:General  Level of Consciousness: drowsy and responds to stimulation  Airway & Oxygen Therapy: Patient Spontanous Breathing and Patient connected to face mask oxygen  Post-op Assessment: Report given to RN and Post -op Vital signs reviewed and stable  Post vital signs: Reviewed and stable  Last Vitals:  Vitals Value Taken Time  BP 84/54 05/03/2018 10:51 AM  Temp 36.1 C 05/03/2018 10:51 AM  Pulse 84 05/03/2018 10:54 AM  Resp 18 05/03/2018 10:54 AM  SpO2 100 % 05/03/2018 10:54 AM  Vitals shown include unvalidated device data.  Last Pain:  Vitals:   05/03/18 1051  TempSrc:   PainSc: Asleep         Complications: No apparent anesthesia complications

## 2018-05-04 ENCOUNTER — Encounter: Payer: Self-pay | Admitting: Gastroenterology

## 2018-05-04 DIAGNOSIS — F329 Major depressive disorder, single episode, unspecified: Secondary | ICD-10-CM

## 2018-05-04 DIAGNOSIS — E785 Hyperlipidemia, unspecified: Secondary | ICD-10-CM

## 2018-05-04 DIAGNOSIS — I10 Essential (primary) hypertension: Secondary | ICD-10-CM

## 2018-05-04 DIAGNOSIS — F419 Anxiety disorder, unspecified: Secondary | ICD-10-CM

## 2018-05-04 DIAGNOSIS — F32A Depression, unspecified: Secondary | ICD-10-CM

## 2018-05-04 DIAGNOSIS — G2581 Restless legs syndrome: Secondary | ICD-10-CM

## 2018-05-04 MED ORDER — LISINOPRIL 5 MG PO TABS: 5.0000 mg | ORAL_TABLET | Freq: Every day | ORAL | 0 refills | Status: DC

## 2018-05-04 MED ORDER — GABAPENTIN 600 MG PO TABS: 600.0000 mg | ORAL_TABLET | Freq: Every day | ORAL | 0 refills | Status: DC

## 2018-05-04 MED ORDER — ATORVASTATIN CALCIUM 40 MG PO TABS: 40.0000 mg | ORAL_TABLET | Freq: Every evening | ORAL | 0 refills | Status: DC

## 2018-05-04 MED ORDER — HYDROCHLOROTHIAZIDE 25 MG PO TABS: 25.0000 mg | ORAL_TABLET | Freq: Every day | ORAL | 0 refills | Status: DC

## 2018-05-04 MED ORDER — VENLAFAXINE HCL ER 75 MG PO CP24: ORAL_CAPSULE | ORAL | 0 refills | Status: DC

## 2018-07-30 ENCOUNTER — Other Ambulatory Visit: Payer: Self-pay | Admitting: Primary Care

## 2018-07-30 DIAGNOSIS — F329 Major depressive disorder, single episode, unspecified: Secondary | ICD-10-CM

## 2018-07-30 DIAGNOSIS — F32A Depression, unspecified: Secondary | ICD-10-CM

## 2018-07-30 DIAGNOSIS — I10 Essential (primary) hypertension: Secondary | ICD-10-CM

## 2018-08-05 ENCOUNTER — Other Ambulatory Visit: Payer: Self-pay | Admitting: Primary Care

## 2018-08-05 DIAGNOSIS — G2581 Restless legs syndrome: Secondary | ICD-10-CM

## 2018-08-05 DIAGNOSIS — E785 Hyperlipidemia, unspecified: Secondary | ICD-10-CM

## 2018-08-31 ENCOUNTER — Other Ambulatory Visit: Payer: Self-pay | Admitting: Primary Care

## 2018-08-31 DIAGNOSIS — I10 Essential (primary) hypertension: Secondary | ICD-10-CM

## 2018-09-11 NOTE — Telephone Encounter (Signed)
lvm asking patient to call office °

## 2019-02-23 ENCOUNTER — Other Ambulatory Visit: Payer: Self-pay | Admitting: Primary Care

## 2019-02-23 DIAGNOSIS — F419 Anxiety disorder, unspecified: Secondary | ICD-10-CM

## 2019-02-23 DIAGNOSIS — E785 Hyperlipidemia, unspecified: Secondary | ICD-10-CM

## 2019-02-23 DIAGNOSIS — F329 Major depressive disorder, single episode, unspecified: Secondary | ICD-10-CM

## 2019-02-23 DIAGNOSIS — I10 Essential (primary) hypertension: Secondary | ICD-10-CM

## 2019-04-02 ENCOUNTER — Other Ambulatory Visit: Payer: Self-pay | Admitting: Primary Care

## 2019-04-02 DIAGNOSIS — R7303 Prediabetes: Secondary | ICD-10-CM

## 2019-04-02 DIAGNOSIS — E785 Hyperlipidemia, unspecified: Secondary | ICD-10-CM

## 2019-04-02 DIAGNOSIS — I10 Essential (primary) hypertension: Secondary | ICD-10-CM

## 2019-04-11 ENCOUNTER — Telehealth: Payer: Self-pay

## 2019-04-11 NOTE — Telephone Encounter (Signed)
LVM w COVID screen, front door and back lab info 2.17.2021 TLJ  

## 2019-04-13 ENCOUNTER — Other Ambulatory Visit: Payer: 59

## 2019-04-16 ENCOUNTER — Ambulatory Visit (INDEPENDENT_AMBULATORY_CARE_PROVIDER_SITE_OTHER): Payer: BC Managed Care – PPO | Admitting: Primary Care

## 2019-04-16 ENCOUNTER — Encounter: Payer: Self-pay | Admitting: Primary Care

## 2019-04-16 ENCOUNTER — Other Ambulatory Visit: Payer: Self-pay

## 2019-04-16 VITALS — BP 116/80 | HR 70 | Temp 96.6°F | Ht 61.0 in | Wt 150.2 lb

## 2019-04-16 DIAGNOSIS — R7303 Prediabetes: Secondary | ICD-10-CM | POA: Diagnosis not present

## 2019-04-16 DIAGNOSIS — E785 Hyperlipidemia, unspecified: Secondary | ICD-10-CM | POA: Diagnosis not present

## 2019-04-16 DIAGNOSIS — Z0000 Encounter for general adult medical examination without abnormal findings: Secondary | ICD-10-CM

## 2019-04-16 DIAGNOSIS — F329 Major depressive disorder, single episode, unspecified: Secondary | ICD-10-CM

## 2019-04-16 DIAGNOSIS — F32A Depression, unspecified: Secondary | ICD-10-CM

## 2019-04-16 DIAGNOSIS — K219 Gastro-esophageal reflux disease without esophagitis: Secondary | ICD-10-CM

## 2019-04-16 DIAGNOSIS — G2581 Restless legs syndrome: Secondary | ICD-10-CM

## 2019-04-16 DIAGNOSIS — Z23 Encounter for immunization: Secondary | ICD-10-CM | POA: Diagnosis not present

## 2019-04-16 DIAGNOSIS — I10 Essential (primary) hypertension: Secondary | ICD-10-CM | POA: Diagnosis not present

## 2019-04-16 DIAGNOSIS — F419 Anxiety disorder, unspecified: Secondary | ICD-10-CM

## 2019-04-16 DIAGNOSIS — Z1231 Encounter for screening mammogram for malignant neoplasm of breast: Secondary | ICD-10-CM

## 2019-04-16 LAB — LIPID PANEL
Cholesterol: 174 mg/dL (ref 0–200)
HDL: 54.1 mg/dL (ref >39.00)
LDL Cholesterol: 81 mg/dL (ref 0–99)
NonHDL: 120
Total CHOL/HDL Ratio: 3
Triglycerides: 193 mg/dL — ABNORMAL HIGH (ref 0.0–149.0)
VLDL: 38.6 mg/dL (ref 0.0–40.0)

## 2019-04-16 LAB — COMPREHENSIVE METABOLIC PANEL
ALT: 20 U/L (ref 0–35)
AST: 17 U/L (ref 0–37)
Albumin: 4.4 g/dL (ref 3.5–5.2)
Alkaline Phosphatase: 84 U/L (ref 39–117)
BUN: 15 mg/dL (ref 6–23)
CO2: 29 mEq/L (ref 19–32)
Calcium: 9.4 mg/dL (ref 8.4–10.5)
Chloride: 105 mEq/L (ref 96–112)
Creatinine, Ser: 0.71 mg/dL (ref 0.40–1.20)
GFR: 83.56 mL/min (ref >60.00)
Glucose, Bld: 111 mg/dL — ABNORMAL HIGH (ref 70–99)
Potassium: 4 mEq/L (ref 3.5–5.1)
Sodium: 141 mEq/L (ref 135–145)
Total Bilirubin: 0.3 mg/dL (ref 0.2–1.2)
Total Protein: 6.4 g/dL (ref 6.0–8.3)

## 2019-04-16 LAB — CBC
HCT: 34.9 % — ABNORMAL LOW (ref 36.0–46.0)
Hemoglobin: 10.8 g/dL — ABNORMAL LOW (ref 12.0–15.0)
MCHC: 31.1 g/dL (ref 30.0–36.0)
MCV: 76.5 fl — ABNORMAL LOW (ref 78.0–100.0)
Platelets: 252 10*3/uL (ref 150.0–400.0)
RBC: 4.56 Mil/uL (ref 3.87–5.11)
RDW: 20.3 % — ABNORMAL HIGH (ref 11.5–15.5)
WBC: 4.8 10*3/uL (ref 4.0–10.5)

## 2019-04-16 LAB — HEMOGLOBIN A1C: Hgb A1c MFr Bld: 5.9 % (ref 4.6–6.5)

## 2019-04-16 MED ORDER — VENLAFAXINE HCL ER 150 MG PO CP24: 150.0000 mg | ORAL_CAPSULE | Freq: Every day | ORAL | 0 refills | Status: DC

## 2019-04-16 NOTE — Patient Instructions (Signed)
Stop by the lab prior to leaving today. I will notify you of your results once received.   Start exercising. You should be getting 150 minutes of moderate intensity exercise weekly.  It's important to improve your diet by reducing consumption of fast food, fried food, processed snack foods, sugary drinks. Increase consumption of fresh vegetables and fruits, whole grains, water.  Ensure you are drinking 64 ounces of water daily.  We've increased the dose of your venlafaxine to 150 mg, I sent a new prescription to your pharmacy.  Please update me in 4 weeks.  Schedule a nurse visit for the second Shingrix vaccine, 2 to 6 months from now.  It was a pleasure to see you today!   Preventive Care 41-14 Years Old, Female Preventive care refers to visits with your health care provider and lifestyle choices that can promote health and wellness. This includes:  A yearly physical exam. This may also be called an annual well check.  Regular dental visits and eye exams.  Immunizations.  Screening for certain conditions.  Healthy lifestyle choices, such as eating a healthy diet, getting regular exercise, not using drugs or products that contain nicotine and tobacco, and limiting alcohol use. What can I expect for my preventive care visit? Physical exam Your health care provider will check your:  Height and weight. This may be used to calculate body mass index (BMI), which tells if you are at a healthy weight.  Heart rate and blood pressure.  Skin for abnormal spots. Counseling Your health care provider may ask you questions about your:  Alcohol, tobacco, and drug use.  Emotional well-being.  Home and relationship well-being.  Sexual activity.  Eating habits.  Work and work Statistician.  Method of birth control.  Menstrual cycle.  Pregnancy history. What immunizations do I need?  Influenza (flu) vaccine  This is recommended every year. Tetanus, diphtheria, and pertussis  (Tdap) vaccine  You may need a Td booster every 10 years. Varicella (chickenpox) vaccine  You may need this if you have not been vaccinated. Zoster (shingles) vaccine  You may need this after age 16. Measles, mumps, and rubella (MMR) vaccine  You may need at least one dose of MMR if you were born in 1957 or later. You may also need a second dose. Pneumococcal conjugate (PCV13) vaccine  You may need this if you have certain conditions and were not previously vaccinated. Pneumococcal polysaccharide (PPSV23) vaccine  You may need one or two doses if you smoke cigarettes or if you have certain conditions. Meningococcal conjugate (MenACWY) vaccine  You may need this if you have certain conditions. Hepatitis A vaccine  You may need this if you have certain conditions or if you travel or work in places where you may be exposed to hepatitis A. Hepatitis B vaccine  You may need this if you have certain conditions or if you travel or work in places where you may be exposed to hepatitis B. Haemophilus influenzae type b (Hib) vaccine  You may need this if you have certain conditions. Human papillomavirus (HPV) vaccine  If recommended by your health care provider, you may need three doses over 6 months. You may receive vaccines as individual doses or as more than one vaccine together in one shot (combination vaccines). Talk with your health care provider about the risks and benefits of combination vaccines. What tests do I need? Blood tests  Lipid and cholesterol levels. These may be checked every 5 years, or more frequently if you  are over 49 years old.  Hepatitis C test.  Hepatitis B test. Screening  Lung cancer screening. You may have this screening every year starting at age 68 if you have a 30-pack-year history of smoking and currently smoke or have quit within the past 15 years.  Colorectal cancer screening. All adults should have this screening starting at age 36 and  continuing until age 54. Your health care provider may recommend screening at age 75 if you are at increased risk. You will have tests every 1-10 years, depending on your results and the type of screening test.  Diabetes screening. This is done by checking your blood sugar (glucose) after you have not eaten for a while (fasting). You may have this done every 1-3 years.  Mammogram. This may be done every 1-2 years. Talk with your health care provider about when you should start having regular mammograms. This may depend on whether you have a family history of breast cancer.  BRCA-related cancer screening. This may be done if you have a family history of breast, ovarian, tubal, or peritoneal cancers.  Pelvic exam and Pap test. This may be done every 3 years starting at age 15. Starting at age 36, this may be done every 5 years if you have a Pap test in combination with an HPV test. Other tests  Sexually transmitted disease (STD) testing.  Bone density scan. This is done to screen for osteoporosis. You may have this scan if you are at high risk for osteoporosis. Follow these instructions at home: Eating and drinking  Eat a diet that includes fresh fruits and vegetables, whole grains, lean protein, and low-fat dairy.  Take vitamin and mineral supplements as recommended by your health care provider.  Do not drink alcohol if: ? Your health care provider tells you not to drink. ? You are pregnant, may be pregnant, or are planning to become pregnant.  If you drink alcohol: ? Limit how much you have to 0-1 drink a day. ? Be aware of how much alcohol is in your drink. In the [place] one drink equals one 12 oz bottle of beer (355 mL), one 5 oz glass of wine (148 mL), or one 1 oz glass of hard liquor (44 mL). Lifestyle  Take daily care of your teeth and gums.  Stay active. Exercise for at least 30 minutes on 5 or more days each week.  Do not use any products that contain nicotine or tobacco,  such as cigarettes, e-cigarettes, and chewing tobacco. If you need help quitting, ask your health care provider.  If you are sexually active, practice safe sex. Use a condom or other form of birth control (contraception) in order to prevent pregnancy and STIs (sexually transmitted infections).  If told by your health care provider, take low-dose aspirin daily starting at age 54. What's next?  Visit your health care provider once a year for a well check visit.  Ask your health care provider how often you should have your eyes and teeth checked.  Stay up to date on all vaccines. This information is not intended to replace advice given to you by your health care provider. Make sure you discuss any questions you have with your health care provider. Document Revised: 10/20/2017 Document Reviewed: 10/20/2017 Elsevier Patient Education  2020 [employer].

## 2019-04-16 NOTE — Assessment & Plan Note (Signed)
Well controlled on gabapentin HS, continue same.  

## 2019-04-16 NOTE — Assessment & Plan Note (Signed)
Encouraged a healthy diet, regular exercise. Repeat lipid panel pending. Compliant to atorvastatin.

## 2019-04-16 NOTE — Assessment & Plan Note (Signed)
Deteriorated with active symptoms, has been on current dose for years. Discussed options for treatment, she would like to trial a dose increase of venlafaxine.  Increase dose to 150 mg. She will update in 4 weeks.

## 2019-04-16 NOTE — Assessment & Plan Note (Signed)
Repeat A1C pending.  Discussed the importance of a healthy diet and regular exercise in order for weight loss, and to reduce the risk of any potential medical problems.  

## 2019-04-16 NOTE — Progress Notes (Signed)
Subjective:    Patient ID: Nicole Buchanan, female    DOB: 04-Jun-1955, 64 y.o.   MRN: [id number]  HPI  This visit occurred during the SARS-CoV-2 public health emergency.  Safety protocols were in place, including screening questions prior to the visit, additional usage of staff PPE, and extensive cleaning of exam room while observing appropriate contact time as indicated for disinfecting solutions.   [name] is a 64 year old female who presents today for complete physical.  She has noticed intermittent symptoms of little motivation to do things, feeling anxious/nervous, feeling down/sad, worries for no reason. She is compliant to her venlafaxine XR 75 mg, would like to consider a dose increase. Denies SI/HI.   Immunizations: -Tetanus: Completed in 2018 -Influenza: Completed this season  -Shingles: Never completed   Diet: She endorses a fair diet.  Exercise: She is not exercising   Eye exam: Due today Dental exam: No recent exam  Pap Smear: Hysterectomy  Mammogram: Completed in 2019 Colonoscopy: Completed in 2020, due again 2030 Hep C Screen: Negative   BP Readings from Last 3 Encounters:  04/16/19 116/80  05/03/18 102/66  04/11/18 126/86   Wt Readings from Last 3 Encounters:  04/16/19 150 lb 4 oz (68.2 kg)  05/03/18 144 lb (65.3 kg)  04/11/18 143 lb (64.9 kg)     Review of Systems  Constitutional: Negative for unexpected weight change.  HENT: Negative for rhinorrhea.   Respiratory: Negative for cough and shortness of breath.   Cardiovascular: Negative for chest pain.  Gastrointestinal: Negative for constipation and diarrhea.  Genitourinary: Negative for difficulty urinating.  Musculoskeletal: Negative for arthralgias and myalgias.  Skin: Negative for rash.  Allergic/Immunologic: Negative for environmental allergies.  Neurological: Negative for dizziness, numbness and headaches.  Psychiatric/Behavioral: The patient is nervous/anxious.        See HPI        Past Medical History:  Diagnosis Date  . Depression   . GERD (gastroesophageal reflux disease)   . Hiatal hernia   . Hyperlipemia   . Hypertension      Social History   Socioeconomic History  . Marital status: Married    Spouse name: Not on file  . Number of children: Not on file  . Years of education: Not on file  . Highest education level: Not on file  Occupational History  . Not on file  Tobacco Use  . Smoking status: Former Smoker    Quit date: 06/27/2001    Years since quitting: 17.8  . Smokeless tobacco: Never Used  Substance and Sexual Activity  . Alcohol use: Yes  . Drug use: No  . Sexual activity: Not on file  Other Topics Concern  . Not on file  Social History Narrative   Married.   2 children. 1 grandson.   Works as a Agricultural engineer.   Enjoys spending time with family, gardening.   Social Determinants of Health   Financial Resource Strain:   . Difficulty of Paying Living Expenses: Not on file  Food Insecurity:   . Worried About Charity fundraiser in the Last Year: Not on file  . Ran Out of Food in the Last Year: Not on file  Transportation Needs:   . Lack of Transportation (Medical): Not on file  . Lack of Transportation (Non-Medical): Not on file  Physical Activity:   . Days of Exercise per Week: Not on file  . Minutes of Exercise per Session: Not on file  Stress:   .  Feeling of Stress : Not on file  Social Connections:   . Frequency of Communication with Friends and Family: Not on file  . Frequency of Social Gatherings with Friends and Family: Not on file  . Attends Religious Services: Not on file  . Active Member of Clubs or Organizations: Not on file  . Attends Banker Meetings: Not on file  . Marital Status: Not on file  Intimate Partner Violence:   . Fear of Current or Ex-Partner: Not on file  . Emotionally Abused: Not on file  . Physically Abused: Not on file  . Sexually Abused: Not on file    Past Surgical History:   Procedure Laterality Date  . ABDOMINAL HYSTERECTOMY    . CESAREAN SECTION    . COLONOSCOPY WITH PROPOFOL N/A 05/03/2018   Procedure: COLONOSCOPY WITH PROPOFOL;  Surgeon: Pasty Spillers, MD;  Location: [facility];  Service: Endoscopy;  Laterality: N/A;  . DORSAL COMPARTMENT RELEASE  06/29/2011   Procedure: RELEASE DORSAL COMPARTMENT (DEQUERVAIN);  Surgeon: Nicki Reaper, MD;  Location: [facility];  Service: Orthopedics;  Laterality: Right;  . TONSILLECTOMY      Family History  Problem Relation Age of Onset  . Breast cancer Cousin        maternal side -50's  . COPD Father     Allergies  Allergen Reactions  . Codeine Nausea And Vomiting  . Penicillins     dizzy    Current Outpatient Medications on File Prior to Visit  Medication Sig Dispense Refill  . aspirin EC 81 MG tablet Take by mouth.    Marland Kitchen atorvastatin (LIPITOR) 40 MG tablet TAKE 1 TABLET BY MOUTH IN THE EVENING FOR CHOLESTEROL 90 tablet 0  . Calcium Carb-Cholecalciferol (CALCIUM 600 + D PO) Take by mouth.    . gabapentin (NEURONTIN) 600 MG tablet TAKE 1 TABLET BY MOUTH AT BEDTIME FOR  RESTLESS  LEGS 90 tablet 1  . hydrochlorothiazide (HYDRODIURIL) 25 MG tablet Take 1 tablet by mouth once daily for blood pressure 90 tablet 2  . lisinopril (ZESTRIL) 5 MG tablet Take 1 tablet by mouth once daily for blood pressure 90 tablet 0  . omeprazole (PRILOSEC) 20 MG capsule Take 20 mg by mouth daily.    . [DISCONTINUED] venlafaxine XR (EFFEXOR-XR) 75 MG 24 hr capsule TAKE 1 CAPSULE BY MOUTH ONCE DAILY WITH BREAKFAST FOR ANXIETY AND FOR DEPRESSION 90 capsule 0   No current facility-administered medications on file prior to visit.    BP 116/80   Pulse 70   Temp (!) 96.6 F (35.9 C) (Temporal)   Ht 5\' 1"  (1.549 m)   Wt 150 lb 4 oz (68.2 kg)   SpO2 97%   BMI 28.39 kg/m    Objective:   Physical Exam  Constitutional: She is oriented to person, place, and time. She appears well-nourished.  HENT:  Right Ear:  Tympanic membrane and ear canal normal.  Left Ear: Tympanic membrane and ear canal normal.  Mouth/Throat: Oropharynx is clear and moist.  Eyes: Pupils are equal, round, and reactive to light. EOM are normal.  Cardiovascular: Normal rate and regular rhythm.  Respiratory: Effort normal and breath sounds normal.  GI: Soft. Bowel sounds are normal. There is no abdominal tenderness.  Musculoskeletal:        General: Normal range of motion.     Cervical back: Neck supple.  Neurological: She is alert and oriented to person, place, and time. No cranial nerve deficit.  Reflex Scores:  Patellar reflexes are 2+ on the right side and 2+ on the left side. Skin: Skin is warm and dry.  Psychiatric: She has a normal mood and affect.           Assessment & Plan:

## 2019-04-16 NOTE — Assessment & Plan Note (Signed)
Does well with omeprazole 20 mg for which she has taken for years. She does have rebound symptoms when missing doses.   Will have her try to wean to every other day for a few months and then update.

## 2019-04-16 NOTE — Assessment & Plan Note (Signed)
Stable in the office today, continue HCTZ and lisinopril.  CMP pending.

## 2019-04-16 NOTE — Addendum Note (Signed)
Addended by: Tawnya Crook on: 04/16/2019 12:43 PM   Modules accepted: Orders

## 2019-04-16 NOTE — Assessment & Plan Note (Signed)
Tetanus and influenza UTD. First Shingrix provided today. Second dose due in 2-6 months. Mammogram due, ordered and pending. Colonoscopy UTD, due in 2030. Encouraged a healthy diet, regular exercise. Exam today unremarkable. Labs pending.

## 2019-04-17 ENCOUNTER — Other Ambulatory Visit: Payer: 59

## 2019-04-17 ENCOUNTER — Other Ambulatory Visit (INDEPENDENT_AMBULATORY_CARE_PROVIDER_SITE_OTHER): Payer: BC Managed Care – PPO

## 2019-04-17 DIAGNOSIS — D649 Anemia, unspecified: Secondary | ICD-10-CM | POA: Diagnosis not present

## 2019-04-17 LAB — IBC PANEL
Iron: 12 ug/dL — ABNORMAL LOW (ref 42–145)
Saturation Ratios: 2.9 % — ABNORMAL LOW (ref 20.0–50.0)
Transferrin: 298 mg/dL (ref 212.0–360.0)

## 2019-04-17 LAB — FERRITIN: Ferritin: 4.2 ng/mL — ABNORMAL LOW (ref 10.0–291.0)

## 2019-05-21 ENCOUNTER — Other Ambulatory Visit: Payer: Self-pay | Admitting: Primary Care

## 2019-05-21 DIAGNOSIS — E785 Hyperlipidemia, unspecified: Secondary | ICD-10-CM

## 2019-05-21 DIAGNOSIS — I10 Essential (primary) hypertension: Secondary | ICD-10-CM

## 2019-05-28 ENCOUNTER — Other Ambulatory Visit: Payer: Self-pay | Admitting: Primary Care

## 2019-05-28 DIAGNOSIS — G2581 Restless legs syndrome: Secondary | ICD-10-CM

## 2019-06-06 ENCOUNTER — Other Ambulatory Visit: Payer: Self-pay | Admitting: Primary Care

## 2019-06-06 DIAGNOSIS — I10 Essential (primary) hypertension: Secondary | ICD-10-CM

## 2019-06-11 ENCOUNTER — Other Ambulatory Visit (INDEPENDENT_AMBULATORY_CARE_PROVIDER_SITE_OTHER): Payer: BC Managed Care – PPO

## 2019-06-11 DIAGNOSIS — E785 Hyperlipidemia, unspecified: Secondary | ICD-10-CM | POA: Diagnosis not present

## 2019-06-11 DIAGNOSIS — R7303 Prediabetes: Secondary | ICD-10-CM

## 2019-06-11 DIAGNOSIS — D649 Anemia, unspecified: Secondary | ICD-10-CM

## 2019-06-11 DIAGNOSIS — I10 Essential (primary) hypertension: Secondary | ICD-10-CM | POA: Diagnosis not present

## 2019-06-11 LAB — COMPREHENSIVE METABOLIC PANEL
ALT: 23 U/L (ref 0–35)
AST: 19 U/L (ref 0–37)
Albumin: 4.2 g/dL (ref 3.5–5.2)
Alkaline Phosphatase: 72 U/L (ref 39–117)
BUN: 18 mg/dL (ref 6–23)
CO2: 28 mEq/L (ref 19–32)
Calcium: 9.5 mg/dL (ref 8.4–10.5)
Chloride: 106 mEq/L (ref 96–112)
Creatinine, Ser: 0.73 mg/dL (ref 0.40–1.20)
GFR: 80.88 mL/min (ref >60.00)
Glucose, Bld: 100 mg/dL — ABNORMAL HIGH (ref 70–99)
Potassium: 3.6 mEq/L (ref 3.5–5.1)
Sodium: 141 mEq/L (ref 135–145)
Total Bilirubin: 0.4 mg/dL (ref 0.2–1.2)
Total Protein: 6.4 g/dL (ref 6.0–8.3)

## 2019-06-11 LAB — CBC
HCT: 41.1 % (ref 36.0–46.0)
Hemoglobin: 13.8 g/dL (ref 12.0–15.0)
MCHC: 33.5 g/dL (ref 30.0–36.0)
MCV: 88 fl (ref 78.0–100.0)
Platelets: 199 10*3/uL (ref 150.0–400.0)
RBC: 4.67 Mil/uL (ref 3.87–5.11)
RDW: 22.9 % — ABNORMAL HIGH (ref 11.5–15.5)
WBC: 5.1 10*3/uL (ref 4.0–10.5)

## 2019-06-11 LAB — LIPID PANEL
Cholesterol: 191 mg/dL (ref 0–200)
HDL: 53.7 mg/dL (ref >39.00)
NonHDL: 137.28
Total CHOL/HDL Ratio: 4
Triglycerides: 229 mg/dL — ABNORMAL HIGH (ref 0.0–149.0)
VLDL: 45.8 mg/dL — ABNORMAL HIGH (ref 0.0–40.0)

## 2019-06-11 LAB — IBC + FERRITIN
Ferritin: 16.6 ng/mL (ref 10.0–291.0)
Iron: 86 ug/dL (ref 42–145)
Saturation Ratios: 25.2 % (ref 20.0–50.0)
Transferrin: 244 mg/dL (ref 212.0–360.0)

## 2019-06-11 LAB — HEMOGLOBIN A1C: Hgb A1c MFr Bld: 5.4 % (ref 4.6–6.5)

## 2019-06-11 LAB — LDL CHOLESTEROL, DIRECT: Direct LDL: 91 mg/dL

## 2019-06-11 NOTE — Addendum Note (Signed)
Addended by: Alvina Chou on: 06/11/2019 08:49 AM   Modules accepted: Orders

## 2019-07-05 DIAGNOSIS — D509 Iron deficiency anemia, unspecified: Secondary | ICD-10-CM

## 2019-07-24 ENCOUNTER — Other Ambulatory Visit: Payer: Self-pay | Admitting: Primary Care

## 2019-07-24 DIAGNOSIS — F419 Anxiety disorder, unspecified: Secondary | ICD-10-CM

## 2019-07-27 ENCOUNTER — Other Ambulatory Visit: Payer: Self-pay

## 2019-07-27 ENCOUNTER — Ambulatory Visit (INDEPENDENT_AMBULATORY_CARE_PROVIDER_SITE_OTHER): Payer: BC Managed Care – PPO | Admitting: Primary Care

## 2019-07-27 ENCOUNTER — Encounter: Payer: Self-pay | Admitting: Primary Care

## 2019-07-27 DIAGNOSIS — R197 Diarrhea, unspecified: Secondary | ICD-10-CM

## 2019-07-27 HISTORY — DX: Diarrhea, unspecified: R19.7

## 2019-07-27 LAB — CBC WITH DIFFERENTIAL/PLATELET
Basophils Absolute: 0 10*3/uL (ref 0.0–0.1)
Basophils Relative: 0.9 % (ref 0.0–3.0)
Eosinophils Absolute: 0.1 10*3/uL (ref 0.0–0.7)
Eosinophils Relative: 2.7 % (ref 0.0–5.0)
HCT: 41.9 % (ref 36.0–46.0)
Hemoglobin: 14.4 g/dL (ref 12.0–15.0)
Lymphocytes Relative: 22.2 % (ref 12.0–46.0)
Lymphs Abs: 1.1 10*3/uL (ref 0.7–4.0)
MCHC: 34.4 g/dL (ref 30.0–36.0)
MCV: 91.7 fl (ref 78.0–100.0)
Monocytes Absolute: 0.5 10*3/uL (ref 0.1–1.0)
Monocytes Relative: 9.6 % (ref 3.0–12.0)
Neutro Abs: 3.1 10*3/uL (ref 1.4–7.7)
Neutrophils Relative %: 64.6 % (ref 43.0–77.0)
Platelets: 207 10*3/uL (ref 150.0–400.0)
RBC: 4.57 Mil/uL (ref 3.87–5.11)
RDW: 16.9 % — ABNORMAL HIGH (ref 11.5–15.5)
WBC: 4.8 10*3/uL (ref 4.0–10.5)

## 2019-07-27 LAB — COMPREHENSIVE METABOLIC PANEL
ALT: 27 U/L (ref 0–35)
AST: 23 U/L (ref 0–37)
Albumin: 4.4 g/dL (ref 3.5–5.2)
Alkaline Phosphatase: 73 U/L (ref 39–117)
BUN: 16 mg/dL (ref 6–23)
CO2: 30 mEq/L (ref 19–32)
Calcium: 10.2 mg/dL (ref 8.4–10.5)
Chloride: 103 mEq/L (ref 96–112)
Creatinine, Ser: 0.75 mg/dL (ref 0.40–1.20)
GFR: 78.36 mL/min (ref >60.00)
Glucose, Bld: 80 mg/dL (ref 70–99)
Potassium: 3.6 mEq/L (ref 3.5–5.1)
Sodium: 140 mEq/L (ref 135–145)
Total Bilirubin: 0.5 mg/dL (ref 0.2–1.2)
Total Protein: 6.6 g/dL (ref 6.0–8.3)

## 2019-07-27 NOTE — Progress Notes (Signed)
Subjective:    Patient ID: Nicole Buchanan, female    DOB: 06-06-1955, 64 y.o.   MRN: [id number]  HPI  This visit occurred during the SARS-CoV-2 public health emergency.  Safety protocols were in place, including screening questions prior to the visit, additional usage of staff PPE, and extensive cleaning of exam room while observing appropriate contact time as indicated for disinfecting solutions.   Ms. Kooyman is a 64 year old female with a history of hypertension, GERD, restless legs, hyperlipidemia, palpitations, prediabetes who presents today with a chief complaint of abdominal pain.  Her cramping is located to the mid upper and lower abdomen which began five days ago. Other symptoms include loose stools, non watery stools, decreased appetite. Four days ago she started to notice more watery stools, since then she's had nothing but liquid "spurts" with some solid stool.  She's eating some, but will have diarrhea very soon after. She doesn't experience pain with eating, and will have her episodes without regards to eating. She is drinking plenty of water.  She denies fevers, bloody stools, nausea/vomiting, recent travel, recent antibiotics, RUQ pain, history of diverticulosis. No one else in her home has these symptoms.   Overall her symptoms are slightly better as there is more time in between "bouts"/episodes. She's not taken anything OTC   Review of Systems  Constitutional: Negative for chills and fever.  Gastrointestinal: Positive for abdominal pain and diarrhea. Negative for blood in stool, nausea and vomiting.       Past Medical History:  Diagnosis Date  . Depression   . GERD (gastroesophageal reflux disease)   . Hiatal hernia   . Hyperlipemia   . Hypertension      Social History   Socioeconomic History  . Marital status: Married    Spouse name: Not on file  . Number of children: Not on file  . Years of education: Not on file  . Highest education level: Not on file    Occupational History  . Not on file  Tobacco Use  . Smoking status: Former Smoker    Quit date: 06/27/2001    Years since quitting: 18.0  . Smokeless tobacco: Never Used  Substance and Sexual Activity  . Alcohol use: Yes  . Drug use: No  . Sexual activity: Not on file  Other Topics Concern  . Not on file  Social History Narrative   Married.   2 children. 1 grandson.   Works as a Agricultural engineer.   Enjoys spending time with family, gardening.   Social Determinants of Health   Financial Resource Strain:   . Difficulty of Paying Living Expenses:   Food Insecurity:   . Worried About Charity fundraiser in the Last Year:   . Arboriculturist in the Last Year:   Transportation Needs:   . Film/video editor (Medical):   Marland Kitchen Lack of Transportation (Non-Medical):   Physical Activity:   . Days of Exercise per Week:   . Minutes of Exercise per Session:   Stress:   . Feeling of Stress :   Social Connections:   . Frequency of Communication with Friends and Family:   . Frequency of Social Gatherings with Friends and Family:   . Attends Religious Services:   . Active Member of Clubs or Organizations:   . Attends Archivist Meetings:   Marland Kitchen Marital Status:   Intimate Partner Violence:   . Fear of Current or Ex-Partner:   . Emotionally Abused:   .  Physically Abused:   . Sexually Abused:     Past Surgical History:  Procedure Laterality Date  . ABDOMINAL HYSTERECTOMY    . CESAREAN SECTION    . COLONOSCOPY WITH PROPOFOL N/A 05/03/2018   Procedure: COLONOSCOPY WITH PROPOFOL;  Surgeon: Pasty Spillers, MD;  Location: [facility];  Service: Endoscopy;  Laterality: N/A;  . DORSAL COMPARTMENT RELEASE  06/29/2011   Procedure: RELEASE DORSAL COMPARTMENT (DEQUERVAIN);  Surgeon: Nicki Reaper, MD;  Location: [facility];  Service: Orthopedics;  Laterality: Right;  . TONSILLECTOMY      Family History  Problem Relation Age of Onset  . Breast cancer Cousin         maternal side -50's  . COPD Father     Allergies  Allergen Reactions  . Codeine Nausea And Vomiting  . Penicillins     dizzy    Current Outpatient Medications on File Prior to Visit  Medication Sig Dispense Refill  . aspirin EC 81 MG tablet Take by mouth.    Marland Kitchen atorvastatin (LIPITOR) 40 MG tablet TAKE 1 TABLET BY MOUTH IN THE EVENING FOR CHOLESTEROL 90 tablet 1  . Calcium Carb-Cholecalciferol (CALCIUM 600 + D PO) Take by mouth.    . gabapentin (NEURONTIN) 600 MG tablet TAKE 1 TABLET BY MOUTH AT BEDTIME FOR  RESTLESS  LEGS 90 tablet 1  . hydrochlorothiazide (HYDRODIURIL) 25 MG tablet Take 1 tablet by mouth once daily for blood pressure 90 tablet 3  . lisinopril (ZESTRIL) 5 MG tablet Take 1 tablet by mouth once daily for blood pressure 90 tablet 1  . omeprazole (PRILOSEC) 20 MG capsule Take 20 mg by mouth daily.    Marland Kitchen venlafaxine XR (EFFEXOR-XR) 150 MG 24 hr capsule TAKE 1 CAPSULE BY MOUTH ONCE DAILY WITH BREAKFAST FOR ANXIETY AND FOR DEPRESSION 90 capsule 1   No current facility-administered medications on file prior to visit.    BP 128/86   Pulse 76   Temp (!) 95.3 F (35.2 C) (Temporal)   Ht 5\' 1"  (1.549 m)   Wt 140 lb 8 oz (63.7 kg)   SpO2 98%   BMI 26.55 kg/m    Objective:   Physical Exam  Constitutional: She appears well-nourished.  GI: Soft. Bowel sounds are normal. There is abdominal tenderness in the epigastric area and periumbilical area.    Skin: Skin is warm and dry.           Assessment & Plan:

## 2019-07-27 NOTE — Assessment & Plan Note (Signed)
Intermittent and acute, also with abdominal cramping, for the last 4-5 days.   Exam and HPI today without suspicion of gallbladder involvement, H pylori. She has no evidence of diverticulosis on colonoscopy from 2020.   Suspect viral involvement, but given duration of symptoms will need to get labs.   Checking CBC with Diff, CMP, Stool studies. Lower suspicion for c-diff.  Continue with a bland diet and hydration, ED precautions provided.

## 2019-07-27 NOTE — Addendum Note (Signed)
Addended by: Aquilla Solian on: 07/27/2019 04:29 PM   Modules accepted: Orders

## 2019-07-27 NOTE — Patient Instructions (Addendum)
Stop by the lab prior to leaving today. I will notify you of your results once received.   Be sure to hydrate well with water.   Advance your diet slowly with very bland food.  It was a pleasure to see you today!   Food Choices to Help Relieve Diarrhea, Adult When you have diarrhea, the foods you eat and your eating habits are very important. Choosing the right foods and drinks can help:  Relieve diarrhea.  Replace lost fluids and nutrients.  Prevent dehydration. What general guidelines should I follow?  Relieving diarrhea  Choose foods with less than 2 g or .07 oz. of fiber per serving.  Limit fats to less than 8 tsp (38 g or 1.34 oz.) a day.  Avoid the following: ? Foods and beverages sweetened with high-fructose corn syrup, honey, or sugar alcohols such as xylitol, sorbitol, and mannitol. ? Foods that contain a lot of fat or sugar. ? Fried, greasy, or spicy foods. ? High-fiber grains, breads, and cereals. ? Raw fruits and vegetables.  Eat foods that are rich in probiotics. These foods include dairy products such as yogurt and fermented milk products. They help increase healthy bacteria in the stomach and intestines (gastrointestinal tract, or GI tract).  If you have lactose intolerance, avoid dairy products. These may make your diarrhea worse.  Take medicine to help stop diarrhea (antidiarrheal medicine) only as told by your health care provider. Replacing nutrients  Eat small meals or snacks every 3-4 hours.  Eat bland foods, such as white rice, toast, or baked potato, until your diarrhea starts to get better. Gradually reintroduce nutrient-rich foods as tolerated or as told by your health care provider. This includes: ? Well-cooked protein foods. ? Peeled, seeded, and soft-cooked fruits and vegetables. ? Low-fat dairy products.  Take vitamin and mineral supplements as told by your health care provider. Preventing dehydration  Start by sipping water or a special  solution to prevent dehydration (oral rehydration solution, ORS). Urine that is clear or pale yellow means that you are getting enough fluid.  Try to drink at least 8-10 cups of fluid each day to help replace lost fluids.  You may add other liquids in addition to water, such as clear juice or decaffeinated sports drinks, as tolerated or as told by your health care provider.  Avoid drinks with caffeine, such as coffee, tea, or soft drinks.  Avoid alcohol. What foods are recommended?     The items listed may not be a complete list. Talk with your health care provider about what dietary choices are best for you. Grains White rice. White, [place], or pita breads (fresh or toasted), including plain rolls, buns, or bagels. White pasta. Saltine, soda, or graham crackers. Pretzels. Low-fiber cereal. Cooked cereals made with water (such as cornmeal, farina, or cream cereals). Plain muffins. Matzo. Melba toast. Zwieback. Vegetables Potatoes (without the skin). Most well-cooked and canned vegetables without skins or seeds. Tender lettuce. Fruits Apple sauce. Fruits canned in juice. Cooked apricots, cherries, grapefruit, peaches, pears, or plums. Fresh bananas and cantaloupe. Meats and other protein foods Baked or boiled chicken. Eggs. Tofu. Fish. Seafood. Smooth nut butters. Ground or well-cooked tender beef, ham, veal, lamb, pork, or poultry. Dairy Plain yogurt, kefir, and unsweetened liquid yogurt. Lactose-free milk, buttermilk, skim milk, or soy milk. Low-fat or nonfat hard cheese. Beverages Water. Low-calorie sports drinks. Fruit juices without pulp. Strained tomato and vegetable juices. Decaffeinated teas. Sugar-free beverages not sweetened with sugar alcohols. Oral rehydration solutions, if approved  by your health care provider. Seasoning and other foods Bouillon, broth, or soups made from recommended foods. What foods are not recommended? The items listed may not be a complete list. Talk with  your health care provider about what dietary choices are best for you. Grains Whole grain, whole wheat, bran, or rye breads, rolls, pastas, and crackers. Wild or brown rice. Whole grain or bran cereals. Barley. Oats and oatmeal. Corn tortillas or taco shells. Granola. Popcorn. Vegetables Raw vegetables. Fried vegetables. Cabbage, broccoli, Brussels sprouts, artichokes, baked beans, beet greens, corn, kale, legumes, peas, sweet potatoes, and yams. Potato skins. Cooked spinach and cabbage. Fruits Dried fruit, including raisins and dates. Raw fruits. Stewed or dried prunes. Canned fruits with syrup. Meat and other protein foods Fried or fatty meats. Deli meats. Chunky nut butters. Nuts and seeds. Beans and lentils. Berniece Salines. Hot dogs. Sausage. Dairy High-fat cheeses. Whole milk, chocolate milk, and beverages made with milk, such as milk shakes. Half-and-half. Cream. sour cream. Ice cream. Beverages Caffeinated beverages (such as coffee, tea, soda, or energy drinks). Alcoholic beverages. Fruit juices with pulp. Prune juice. Soft drinks sweetened with high-fructose corn syrup or sugar alcohols. High-calorie sports drinks. Fats and oils Butter. Cream sauces. Margarine. Salad oils. Plain salad dressings. Olives. Avocados. Mayonnaise. Sweets and desserts Sweet rolls, doughnuts, and sweet breads. Sugar-free desserts sweetened with sugar alcohols such as xylitol and sorbitol. Seasoning and other foods Honey. Hot sauce. Chili powder. Gravy. Cream-based or milk-based soups. Pancakes and waffles. Summary  When you have diarrhea, the foods you eat and your eating habits are very important.  Make sure you get at least 8-10 cups of fluid each day, or enough to keep your urine clear or pale yellow.  Eat bland foods and gradually reintroduce healthy, nutrient-rich foods as tolerated, or as told by your health care provider.  Avoid high-fiber, fried, greasy, or spicy foods. This information is not intended to  replace advice given to you by your health care provider. Make sure you discuss any questions you have with your health care provider. Document Revised: 06/01/2018 Document Reviewed: 02/06/2016 Elsevier Patient Education  [place].

## 2019-07-30 ENCOUNTER — Other Ambulatory Visit: Payer: BC Managed Care – PPO

## 2019-07-30 DIAGNOSIS — R197 Diarrhea, unspecified: Secondary | ICD-10-CM

## 2019-08-01 LAB — GASTROINTESTINAL PATHOGEN PANEL PCR
C. difficile Tox A/B, PCR: NOT DETECTED
Campylobacter, PCR: NOT DETECTED
Cryptosporidium, PCR: NOT DETECTED
E coli (ETEC) LT/ST PCR: NOT DETECTED
E coli (STEC) stx1/stx2, PCR: NOT DETECTED
E coli 0157, PCR: NOT DETECTED
Giardia lamblia, PCR: NOT DETECTED
Norovirus, PCR: NOT DETECTED
Rotavirus A, PCR: NOT DETECTED
Salmonella, PCR: NOT DETECTED
Shigella, PCR: NOT DETECTED

## 2019-11-10 ENCOUNTER — Other Ambulatory Visit: Payer: Self-pay | Admitting: Primary Care

## 2019-11-10 DIAGNOSIS — I10 Essential (primary) hypertension: Secondary | ICD-10-CM

## 2019-11-21 ENCOUNTER — Other Ambulatory Visit: Payer: Self-pay | Admitting: Primary Care

## 2019-11-21 DIAGNOSIS — E785 Hyperlipidemia, unspecified: Secondary | ICD-10-CM

## 2019-12-19 ENCOUNTER — Other Ambulatory Visit: Payer: Self-pay

## 2019-12-19 DIAGNOSIS — G2581 Restless legs syndrome: Secondary | ICD-10-CM

## 2019-12-19 MED ORDER — GABAPENTIN 600 MG PO TABS: ORAL_TABLET | ORAL | 1 refills | Status: DC

## 2020-01-12 ENCOUNTER — Other Ambulatory Visit: Payer: Self-pay | Admitting: Primary Care

## 2020-01-12 DIAGNOSIS — F419 Anxiety disorder, unspecified: Secondary | ICD-10-CM

## 2020-01-12 DIAGNOSIS — F32A Depression, unspecified: Secondary | ICD-10-CM

## 2020-02-20 ENCOUNTER — Other Ambulatory Visit: Payer: Self-pay | Admitting: Primary Care

## 2020-02-20 DIAGNOSIS — I10 Essential (primary) hypertension: Secondary | ICD-10-CM

## 2020-02-21 NOTE — Telephone Encounter (Signed)
Pharmacy requests refill on: Lisinopril 5 mg   LAST REFILL: 11/12/2019 (Q-90, R-0) LAST OV: 07/27/2019 NEXT OV: Not Scheduled  PHARMACY: [id number] [place], [place]

## 2020-02-27 ENCOUNTER — Ambulatory Visit
Admission: RE | Admit: 2020-02-27 | Discharge: 2020-02-27 | Disposition: A | Discharge status: Home / Self Care | Payer: BC Managed Care – PPO | Source: Ambulatory Visit | Attending: Primary Care | Admitting: Primary Care

## 2020-02-27 ENCOUNTER — Other Ambulatory Visit: Payer: Self-pay

## 2020-02-27 DIAGNOSIS — Z1231 Encounter for screening mammogram for malignant neoplasm of breast: Secondary | ICD-10-CM | POA: Diagnosis not present

## 2020-04-24 ENCOUNTER — Other Ambulatory Visit: Payer: Self-pay | Admitting: Primary Care

## 2020-04-24 DIAGNOSIS — F32A Depression, unspecified: Secondary | ICD-10-CM

## 2020-04-24 NOTE — Telephone Encounter (Signed)
Called to schedule cpe. LVM to call back as per DPR.

## 2020-04-24 NOTE — Telephone Encounter (Signed)
Patient is overdue for her CPE, needs to schedule to receive further refills.

## 2020-04-25 NOTE — Telephone Encounter (Signed)
Called to schedule follow up/cpe. Unable to connect to patient. Letter sent.

## 2020-05-24 ENCOUNTER — Other Ambulatory Visit: Payer: Self-pay | Admitting: Primary Care

## 2020-05-24 DIAGNOSIS — E785 Hyperlipidemia, unspecified: Secondary | ICD-10-CM

## 2020-05-24 DIAGNOSIS — F419 Anxiety disorder, unspecified: Secondary | ICD-10-CM

## 2020-06-01 ENCOUNTER — Other Ambulatory Visit: Payer: Self-pay | Admitting: Primary Care

## 2020-06-01 DIAGNOSIS — I10 Essential (primary) hypertension: Secondary | ICD-10-CM

## 2020-06-01 NOTE — Telephone Encounter (Signed)
Patient is two months overdue for CPE and follow up. Needs to be scheduled. Will send 30 day supply but will need to be seen for additional refills.

## 2020-06-02 NOTE — Telephone Encounter (Signed)
Left message to return call to our office.  

## 2020-06-15 ENCOUNTER — Other Ambulatory Visit: Payer: Self-pay | Admitting: Primary Care

## 2020-06-15 DIAGNOSIS — G2581 Restless legs syndrome: Secondary | ICD-10-CM

## 2020-06-22 ENCOUNTER — Other Ambulatory Visit: Payer: Self-pay | Admitting: Family Medicine

## 2020-06-22 DIAGNOSIS — F32A Depression, unspecified: Secondary | ICD-10-CM

## 2020-07-02 ENCOUNTER — Encounter: Payer: Self-pay | Admitting: Primary Care

## 2020-07-02 ENCOUNTER — Other Ambulatory Visit: Payer: Self-pay

## 2020-07-02 ENCOUNTER — Ambulatory Visit (INDEPENDENT_AMBULATORY_CARE_PROVIDER_SITE_OTHER): Payer: BC Managed Care – PPO | Admitting: Primary Care

## 2020-07-02 ENCOUNTER — Encounter: Payer: BC Managed Care – PPO | Admitting: Primary Care

## 2020-07-02 VITALS — BP 124/62 | HR 67 | Temp 97.4°F | Ht 61.0 in | Wt 148.0 lb

## 2020-07-02 DIAGNOSIS — K219 Gastro-esophageal reflux disease without esophagitis: Secondary | ICD-10-CM | POA: Diagnosis not present

## 2020-07-02 DIAGNOSIS — Z0000 Encounter for general adult medical examination without abnormal findings: Secondary | ICD-10-CM

## 2020-07-02 DIAGNOSIS — F419 Anxiety disorder, unspecified: Secondary | ICD-10-CM

## 2020-07-02 DIAGNOSIS — E785 Hyperlipidemia, unspecified: Secondary | ICD-10-CM

## 2020-07-02 DIAGNOSIS — G2581 Restless legs syndrome: Secondary | ICD-10-CM

## 2020-07-02 DIAGNOSIS — F32A Depression, unspecified: Secondary | ICD-10-CM

## 2020-07-02 DIAGNOSIS — Z23 Encounter for immunization: Secondary | ICD-10-CM

## 2020-07-02 DIAGNOSIS — I10 Essential (primary) hypertension: Secondary | ICD-10-CM

## 2020-07-02 DIAGNOSIS — R7303 Prediabetes: Secondary | ICD-10-CM | POA: Diagnosis not present

## 2020-07-02 LAB — LIPID PANEL
Cholesterol: 211 mg/dL — ABNORMAL HIGH (ref 0–200)
HDL: 54.2 mg/dL (ref >39.00)
NonHDL: 157.05
Total CHOL/HDL Ratio: 4
Triglycerides: 247 mg/dL — ABNORMAL HIGH (ref 0.0–149.0)
VLDL: 49.4 mg/dL — ABNORMAL HIGH (ref 0.0–40.0)

## 2020-07-02 LAB — COMPREHENSIVE METABOLIC PANEL
ALT: 35 U/L (ref 0–35)
AST: 22 U/L (ref 0–37)
Albumin: 4.5 g/dL (ref 3.5–5.2)
Alkaline Phosphatase: 83 U/L (ref 39–117)
BUN: 17 mg/dL (ref 6–23)
CO2: 31 mEq/L (ref 19–32)
Calcium: 9.8 mg/dL (ref 8.4–10.5)
Chloride: 102 mEq/L (ref 96–112)
Creatinine, Ser: 0.66 mg/dL (ref 0.40–1.20)
GFR: 93.84 mL/min (ref >60.00)
Glucose, Bld: 85 mg/dL (ref 70–99)
Potassium: 3.6 mEq/L (ref 3.5–5.1)
Sodium: 140 mEq/L (ref 135–145)
Total Bilirubin: 0.7 mg/dL (ref 0.2–1.2)
Total Protein: 6.9 g/dL (ref 6.0–8.3)

## 2020-07-02 LAB — CBC
HCT: 42.9 % (ref 36.0–46.0)
Hemoglobin: 15.1 g/dL — ABNORMAL HIGH (ref 12.0–15.0)
MCHC: 35.2 g/dL (ref 30.0–36.0)
MCV: 94.5 fl (ref 78.0–100.0)
Platelets: 235 10*3/uL (ref 150.0–400.0)
RBC: 4.54 Mil/uL (ref 3.87–5.11)
RDW: 12.4 % (ref 11.5–15.5)
WBC: 5.6 10*3/uL (ref 4.0–10.5)

## 2020-07-02 LAB — LDL CHOLESTEROL, DIRECT: Direct LDL: 102 mg/dL

## 2020-07-02 LAB — HEMOGLOBIN A1C: Hgb A1c MFr Bld: 5.6 % (ref 4.6–6.5)

## 2020-07-02 NOTE — Patient Instructions (Signed)
Stop by the lab prior to leaving today. I will notify you of your results once received.   It was a pleasure to see you today!   Preventive Care 40-64 Years Old, Female Preventive care refers to lifestyle choices and visits with your health care provider that can promote health and wellness. This includes:  A yearly physical exam. This is also called an annual wellness visit.  Regular dental and eye exams.  Immunizations.  Screening for certain conditions.  Healthy lifestyle choices, such as: ? Eating a healthy diet. ? Getting regular exercise. ? Not using drugs or products that contain nicotine and tobacco. ? Limiting alcohol use. What can I expect for my preventive care visit? Physical exam Your health care provider will check your:  Height and weight. These may be used to calculate your BMI (body mass index). BMI is a measurement that tells if you are at a healthy weight.  Heart rate and blood pressure.  Body temperature.  Skin for abnormal spots. Counseling Your health care provider may ask you questions about your:  Past medical problems.  Family's medical history.  Alcohol, tobacco, and drug use.  Emotional well-being.  Home life and relationship well-being.  Sexual activity.  Diet, exercise, and sleep habits.  Work and work environment.  Access to firearms.  Method of birth control.  Menstrual cycle.  Pregnancy history. What immunizations do I need? Vaccines are usually given at various ages, according to a schedule. Your health care provider will recommend vaccines for you based on your age, medical history, and lifestyle or other factors, such as travel or where you work.   What tests do I need? Blood tests  Lipid and cholesterol levels. These may be checked every 5 years, or more often if you are over 50 years old.  Hepatitis C test.  Hepatitis B test. Screening  Lung cancer screening. You may have this screening every year starting at  age 55 if you have a 30-pack-year history of smoking and currently smoke or have quit within the past 15 years.  Colorectal cancer screening. ? All adults should have this screening starting at age 50 and continuing until age 75. ? Your health care provider may recommend screening at age 45 if you are at increased risk. ? You will have tests every 1-10 years, depending on your results and the type of screening test.  Diabetes screening. ? This is done by checking your blood sugar (glucose) after you have not eaten for a while (fasting). ? You may have this done every 1-3 years.  Mammogram. ? This may be done every 1-2 years. ? Talk with your health care provider about when you should start having regular mammograms. This may depend on whether you have a family history of breast cancer.  BRCA-related cancer screening. This may be done if you have a family history of breast, ovarian, tubal, or peritoneal cancers.  Pelvic exam and Pap test. ? This may be done every 3 years starting at age 21. ? Starting at age 30, this may be done every 5 years if you have a Pap test in combination with an HPV test. Other tests  STD (sexually transmitted disease) testing, if you are at risk.  Bone density scan. This is done to screen for osteoporosis. You may have this scan if you are at high risk for osteoporosis. Talk with your health care provider about your test results, treatment options, and if necessary, the need for more tests. Follow these instructions   at home: Eating and drinking  Eat a diet that includes fresh fruits and vegetables, whole grains, lean protein, and low-fat dairy products.  Take vitamin and mineral supplements as recommended by your health care provider.  Do not drink alcohol if: ? Your health care provider tells you not to drink. ? You are pregnant, may be pregnant, or are planning to become pregnant.  If you drink alcohol: ? Limit how much you have to 0-1 drink a  day. ? Be aware of how much alcohol is in your drink. In the [place] one drink equals one 12 oz bottle of beer (355 mL), one 5 oz glass of wine (148 mL), or one 1 oz glass of hard liquor (44 mL).   Lifestyle  Take daily care of your teeth and gums. Brush your teeth every morning and night with fluoride toothpaste. Floss one time each day.  Stay active. Exercise for at least 30 minutes 5 or more days each week.  Do not use any products that contain nicotine or tobacco, such as cigarettes, e-cigarettes, and chewing tobacco. If you need help quitting, ask your health care provider.  Do not use drugs.  If you are sexually active, practice safe sex. Use a condom or other form of protection to prevent STIs (sexually transmitted infections).  If you do not wish to become pregnant, use a form of birth control. If you plan to become pregnant, see your health care provider for a prepregnancy visit.  If told by your health care provider, take low-dose aspirin daily starting at age 50.  Find healthy ways to cope with stress, such as: ? Meditation, yoga, or listening to music. ? Journaling. ? Talking to a trusted person. ? Spending time with friends and family. Safety  Always wear your seat belt while driving or riding in a vehicle.  Do not drive: ? If you have been drinking alcohol. Do not ride with someone who has been drinking. ? When you are tired or distracted. ? While texting.  Wear a helmet and other protective equipment during sports activities.  If you have firearms in your house, make sure you follow all gun safety procedures. What's next?  Visit your health care provider once a year for an annual wellness visit.  Ask your health care provider how often you should have your eyes and teeth checked.  Stay up to date on all vaccines. This information is not intended to replace advice given to you by your health care provider. Make sure you discuss any questions you have with your  health care provider. Document Revised: 11/13/2019 Document Reviewed: 10/20/2017 Elsevier Patient Education  2021 Elsevier Inc.  

## 2020-07-02 NOTE — Assessment & Plan Note (Signed)
Compliant to atorvastatin 40 mg, continue same. °Repeat lipid panel pending.  °

## 2020-07-02 NOTE — Addendum Note (Signed)
Addended by: Donnamarie Poag on: 07/02/2020 01:59 PM   Modules accepted: Orders

## 2020-07-02 NOTE — Assessment & Plan Note (Signed)
Could not tolerate symptoms when taking omeprazole every other day, doing much better on daily omeprazole 20 mg, continue same.

## 2020-07-02 NOTE — Assessment & Plan Note (Signed)
Well controlled in the office today, continue lisinopril 5 mg, HCTZ 25 mg.   CMP pending.

## 2020-07-02 NOTE — Progress Notes (Signed)
Subjective:    Patient ID: Nicole Buchanan, female    DOB: 1955-10-27, 65 y.o.   MRN: [id number]  HPI  Nicole Buchanan is a very pleasant 65 y.o. female who presents today for complete physical.  Immunizations: -Tetanus: 2018 -Influenza: Completed this season  -Covid-19: Completed three vaccines -Shingles: Completed 1 dose only   Diet: She endorses a fair diet.  Exercise: No routine exercise   Eye exam: Completes annually  Dental exam: No recent exam  Pap Smear: Hysterectomy  Mammogram: January 2022 Colonoscopy: 2020, due in 2030  BP Readings from Last 3 Encounters:  07/02/20 124/62  07/27/19 128/86  04/16/19 116/80   Wt Readings from Last 3 Encounters:  07/02/20 148 lb (67.1 kg)  07/27/19 140 lb 8 oz (63.7 kg)  04/16/19 150 lb 4 oz (68.2 kg)       Review of Systems  Constitutional: Negative for unexpected weight change.  HENT: Negative for rhinorrhea.   Eyes: Negative for visual disturbance.  Respiratory: Negative for cough and shortness of breath.   Cardiovascular: Negative for chest pain.  Gastrointestinal: Negative for constipation and diarrhea.  Genitourinary: Negative for difficulty urinating.  Musculoskeletal: Negative for arthralgias.  Skin: Negative for rash.  Allergic/Immunologic: Negative for environmental allergies.  Neurological: Negative for dizziness and headaches.  Psychiatric/Behavioral: The patient is not nervous/anxious.          Past Medical History:  Diagnosis Date  . Depression   . GERD (gastroesophageal reflux disease)   . Hiatal hernia   . Hyperlipemia   . Hypertension     Social History   Socioeconomic History  . Marital status: Married    Spouse name: Not on file  . Number of children: Not on file  . Years of education: Not on file  . Highest education level: Not on file  Occupational History  . Not on file  Tobacco Use  . Smoking status: Former Smoker    Quit date: 06/27/2001    Years since quitting: 19.0  .  Smokeless tobacco: Never Used  Vaping Use  . Vaping Use: Never used  Substance and Sexual Activity  . Alcohol use: Yes  . Drug use: No  . Sexual activity: Not on file  Other Topics Concern  . Not on file  Social History Narrative   Married.   2 children. 1 grandson.   Works as a Futures trader.   Enjoys spending time with family, gardening.   Social Determinants of Health   Financial Resource Strain: Not on file  Food Insecurity: Not on file  Transportation Needs: Not on file  Physical Activity: Not on file  Stress: Not on file  Social Connections: Not on file  Intimate Partner Violence: Not on file    Past Surgical History:  Procedure Laterality Date  . ABDOMINAL HYSTERECTOMY    . CESAREAN SECTION    . COLONOSCOPY WITH PROPOFOL N/A 05/03/2018   Procedure: COLONOSCOPY WITH PROPOFOL;  Surgeon: Pasty Spillers, MD;  Location: [facility];  Service: Endoscopy;  Laterality: N/A;  . DORSAL COMPARTMENT RELEASE  06/29/2011   Procedure: RELEASE DORSAL COMPARTMENT (DEQUERVAIN);  Surgeon: Nicki Reaper, MD;  Location: [facility];  Service: Orthopedics;  Laterality: Right;  . TONSILLECTOMY      Family History  Problem Relation Age of Onset  . Breast cancer Cousin        maternal side -50's  . COPD Father     Allergies  Allergen Reactions  . Codeine Nausea And  Vomiting  . Penicillins     dizzy    Current Outpatient Medications on File Prior to Visit  Medication Sig Dispense Refill  . aspirin EC 81 MG tablet Take by mouth.    Marland Kitchen atorvastatin (LIPITOR) 40 MG tablet TAKE 1 TABLET BY MOUTH IN THE EVENING FOR CHOLESTEROL WILL  NEED  OFFICE  VISIT  FOR  MORE  REFILLS 90 tablet 0  . Calcium Carb-Cholecalciferol (CALCIUM 600 + D PO) Take by mouth.    . gabapentin (NEURONTIN) 600 MG tablet TAKE 1 TABLET BY MOUTH AT BEDTIME FOR  RESTLESS  LEG 30 tablet 0  . hydrochlorothiazide (HYDRODIURIL) 25 MG tablet Take 1 tablet by mouth once daily for blood pressure 30 tablet 0   . lisinopril (ZESTRIL) 5 MG tablet TAKE 1 TABLET BY MOUTH ONCE DAILY FOR BLOOD PRESSURE (NEEDS  APPOINTMENT  FOR  MORE  REFILLS) 90 tablet 1  . omeprazole (PRILOSEC) 20 MG capsule Take 20 mg by mouth daily.    Marland Kitchen venlafaxine XR (EFFEXOR-XR) 150 MG 24 hr capsule TAKE 1 CAPSULE BY MOUTH ONCE DAILY WITH BREAKFAST FOR ANXIETY AND FOR DEPRESSION 30 capsule 0   No current facility-administered medications on file prior to visit.    There were no vitals taken for this visit. Objective:   Physical Exam HENT:     Right Ear: Tympanic membrane and ear canal normal.     Left Ear: Tympanic membrane and ear canal normal.     Nose: Nose normal.  Eyes:     Conjunctiva/sclera: Conjunctivae normal.     Pupils: Pupils are equal, round, and reactive to light.  Neck:     Thyroid: No thyromegaly.  Cardiovascular:     Rate and Rhythm: Normal rate and regular rhythm.     Heart sounds: No murmur heard.   Pulmonary:     Effort: Pulmonary effort is normal.     Breath sounds: Normal breath sounds. No rales.  Abdominal:     General: Bowel sounds are normal.     Palpations: Abdomen is soft.     Tenderness: There is no abdominal tenderness.  Musculoskeletal:        General: Normal range of motion.     Cervical back: Neck supple.  Lymphadenopathy:     Cervical: No cervical adenopathy.  Skin:    General: Skin is warm and dry.     Findings: No rash.  Neurological:     Mental Status: She is alert and oriented to person, place, and time.     Cranial Nerves: No cranial nerve deficit.     Deep Tendon Reflexes: Reflexes are normal and symmetric.  Psychiatric:        Mood and Affect: Mood normal.           Assessment & Plan:      This visit occurred during the SARS-CoV-2 public health emergency.  Safety protocols were in place, including screening questions prior to the visit, additional usage of staff PPE, and extensive cleaning of exam room while observing appropriate contact time as indicated  for disinfecting solutions.

## 2020-07-02 NOTE — Assessment & Plan Note (Signed)
Doing well on venlafaxine ER 150 mg, continue same.

## 2020-07-02 NOTE — Assessment & Plan Note (Signed)
Repeat A1C pending.  Discussed the importance of a healthy diet and regular exercise in order for weight loss, and to reduce the risk of any potential medical problems.  

## 2020-07-02 NOTE — Assessment & Plan Note (Signed)
Doing well on gabapentin 600 mg HS, continue same.  

## 2020-07-02 NOTE — Assessment & Plan Note (Signed)
Overdue for second Shingrix, provided today. Other vaccines UTD. Mammogram UTD. Colonoscopy UTD, due in 2030.  Discussed the importance of a healthy diet and regular exercise in order for weight loss, and to reduce the risk of any potential medical problems.  Exam today stable. Labs pending.

## 2020-07-10 ENCOUNTER — Other Ambulatory Visit: Payer: Self-pay | Admitting: Family Medicine

## 2020-07-10 ENCOUNTER — Other Ambulatory Visit: Payer: Self-pay | Admitting: Primary Care

## 2020-07-10 DIAGNOSIS — F32A Depression, unspecified: Secondary | ICD-10-CM

## 2020-07-10 DIAGNOSIS — F419 Anxiety disorder, unspecified: Secondary | ICD-10-CM

## 2020-07-10 DIAGNOSIS — I10 Essential (primary) hypertension: Secondary | ICD-10-CM

## 2020-07-20 ENCOUNTER — Other Ambulatory Visit: Payer: Self-pay | Admitting: Primary Care

## 2020-07-20 DIAGNOSIS — G2581 Restless legs syndrome: Secondary | ICD-10-CM

## 2020-08-17 ENCOUNTER — Other Ambulatory Visit: Payer: Self-pay | Admitting: Family Medicine

## 2020-08-17 ENCOUNTER — Other Ambulatory Visit: Payer: Self-pay | Admitting: Primary Care

## 2020-08-17 DIAGNOSIS — E785 Hyperlipidemia, unspecified: Secondary | ICD-10-CM

## 2020-08-17 DIAGNOSIS — I10 Essential (primary) hypertension: Secondary | ICD-10-CM

## 2021-01-21 ENCOUNTER — Other Ambulatory Visit: Payer: Self-pay

## 2021-01-21 ENCOUNTER — Encounter: Payer: Self-pay | Admitting: Primary Care

## 2021-01-21 ENCOUNTER — Ambulatory Visit (INDEPENDENT_AMBULATORY_CARE_PROVIDER_SITE_OTHER): Payer: BC Managed Care – PPO | Admitting: Primary Care

## 2021-01-21 DIAGNOSIS — H938X2 Other specified disorders of left ear: Secondary | ICD-10-CM

## 2021-01-21 DIAGNOSIS — B029 Zoster without complications: Secondary | ICD-10-CM | POA: Diagnosis not present

## 2021-01-21 DIAGNOSIS — H938X9 Other specified disorders of ear, unspecified ear: Secondary | ICD-10-CM

## 2021-01-21 HISTORY — DX: Zoster without complications: B02.9

## 2021-01-21 HISTORY — DX: Other specified disorders of ear, unspecified ear: H93.8X9

## 2021-01-21 NOTE — Patient Instructions (Signed)
Try using Flonase (fluticasone) nasal spray. Instill 1 spray in each nostril twice daily for the noise sensation.  Please notify me if your rash and symptoms do not continue to improve.   It was a pleasure to see you today!

## 2021-01-21 NOTE — Assessment & Plan Note (Signed)
With drumming/tapping.   Exam today overall benign. Likely effusion, no infection noted.  Trial of Flonase BID discussed x 2 weeks. She has some at home and will try.   She will update.  Consider ENT/audiology if no improvement .

## 2021-01-21 NOTE — Progress Notes (Signed)
Subjective:    Patient ID: Nicole Buchanan, female    DOB: Oct 06, 1955, 65 y.o.   MRN: [id number]  HPI  Nicole Buchanan is a very pleasant 65 y.o. female with a history of hypertension, prediabetes, palpitations who presents today to discuss a few things.  1) Altered Hearing: Located to the left ear with a "tapping and drumming" sensation intermittently. This began one week ago. She denies pain, exposure to loud sounds, rhinorrhea, post nasal drip, right ear symptoms.   She tried some ear wax removal drops OTC without improvement.   2) Skin Wound: Initially began as a blister to the right groin five days ago with itching and pain. Since then she's developed additional blisters with pain moving up the right groin towards the hip. She's noticed improvement over the last 24 hours, lesions have begun to heal. She is managed on gabapentin 600 mg HS, did take one of her husband's 800 mg gabapentin yesterday with improvement.   No new lotions, detergents, soaps or shampoos. No new medicines, vitamins, supplements. No new pets. No recent outdoor exposure or poison ivy exposure. No bonfire or smoke exposure.  No recent motel or hotel stay or new beds.   No fevers/chills, oral lesions, new joint pains, tick bites, abdominal pain, nausea.     Review of Systems  HENT:  Negative for congestion, ear pain, postnasal drip and rhinorrhea.        Noises within the left ear  Skin:  Positive for rash.        Past Medical History:  Diagnosis Date   Depression    GERD (gastroesophageal reflux disease)    Hiatal hernia    Hyperlipemia    Hypertension     Social History   Socioeconomic History   Marital status: Married    Spouse name: Not on file   Number of children: Not on file   Years of education: Not on file   Highest education level: Not on file  Occupational History   Not on file  Tobacco Use   Smoking status: Former    Types: Cigarettes    Quit date: 06/27/2001    Years since  quitting: 19.5   Smokeless tobacco: Never  Vaping Use   Vaping Use: Never used  Substance and Sexual Activity   Alcohol use: Yes   Drug use: No   Sexual activity: Not on file  Other Topics Concern   Not on file  Social History Narrative   Married.   2 children. 1 grandson.   Works as a Futures trader.   Enjoys spending time with family, gardening.   Social Determinants of Health   Financial Resource Strain: Not on file  Food Insecurity: Not on file  Transportation Needs: Not on file  Physical Activity: Not on file  Stress: Not on file  Social Connections: Not on file  Intimate Partner Violence: Not on file    Past Surgical History:  Procedure Laterality Date   ABDOMINAL HYSTERECTOMY     CESAREAN SECTION     COLONOSCOPY WITH PROPOFOL N/A 05/03/2018   Procedure: COLONOSCOPY WITH PROPOFOL;  Surgeon: Pasty Spillers, MD;  Location: [facility];  Service: Endoscopy;  Laterality: N/A;   DORSAL COMPARTMENT RELEASE  06/29/2011   Procedure: RELEASE DORSAL COMPARTMENT (DEQUERVAIN);  Surgeon: Nicki Reaper, MD;  Location: [facility];  Service: Orthopedics;  Laterality: Right;   TONSILLECTOMY      Family History  Problem Relation Age of Onset   Breast  cancer Cousin        maternal side -50's   COPD Father     Allergies  Allergen Reactions   Codeine Nausea And Vomiting   Penicillins     dizzy    Current Outpatient Medications on File Prior to Visit  Medication Sig Dispense Refill   atorvastatin (LIPITOR) 40 MG tablet TAKE 1 TABLET BY MOUTH ONCE DAILY IN THE EVENING FOR  CHOLESTEROL  -  WILL  NEED  OFFICE  VISIT  FOR  MORE  REFILLS 90 tablet 3   Calcium Carb-Cholecalciferol (CALCIUM 600 + D PO) Take by mouth.     gabapentin (NEURONTIN) 600 MG tablet Take 1 tablet (600 mg total) by mouth at bedtime. For restless legs. 90 tablet 3   hydrochlorothiazide (HYDRODIURIL) 25 MG tablet Take 1 tablet (25 mg total) by mouth daily. For blood pressure. 90 tablet 3    lisinopril (ZESTRIL) 5 MG tablet TAKE 1 TABLET BY MOUTH ONCE DAILY FOR BLOOD PRESSURE 90 tablet 3   omeprazole (PRILOSEC) 20 MG capsule Take 20 mg by mouth daily.     venlafaxine XR (EFFEXOR-XR) 150 MG 24 hr capsule TAKE 1 CAPSULE BY MOUTH ONCE DAILY WITH BREAKFAST FOR ANXIETY AND FOR DEPRESSION 90 capsule 1   aspirin EC 81 MG tablet Take by mouth. (Patient not taking: Reported on 01/21/2021)     No current facility-administered medications on file prior to visit.    BP 136/78   Pulse 77   Temp 98.6 F (37 C) (Temporal)   Ht 5\' 1"  (1.549 m)   Wt 145 lb (65.8 kg)   SpO2 98%   BMI 27.40 kg/m  Objective:   Physical Exam Constitutional:      Appearance: She is not ill-appearing.  HENT:     Right Ear: Ear canal normal.     Left Ear: Ear canal normal.     Ears:     Comments: Mildly bulging TM's without erythema  Pulmonary:     Effort: Pulmonary effort is normal.     Breath sounds: Normal breath sounds.  Skin:    Findings: Rash present.     Comments: 3 small, crusted healing lesions to right labia majora. No surrounding erythema or other lesions. Non tender.   Neurological:     Mental Status: She is alert.          Assessment & Plan:      This visit occurred during the SARS-CoV-2 public health emergency.  Safety protocols were in place, including screening questions prior to the visit, additional usage of staff PPE, and extensive cleaning of exam room while observing appropriate contact time as indicated for disinfecting solutions.

## 2021-01-21 NOTE — Assessment & Plan Note (Signed)
Appears to be healing.  Discussed use of Valtrex, but since symptoms began >72 hours ago, and she is healing, we decided to forego treatment. She agrees.  Offered additional gabapentin, she kindly declines as she's feeling better. She will update if symptoms return/do not continue to improve.

## 2021-01-23 ENCOUNTER — Other Ambulatory Visit: Payer: Self-pay | Admitting: Primary Care

## 2021-01-23 DIAGNOSIS — F419 Anxiety disorder, unspecified: Secondary | ICD-10-CM

## 2021-01-23 DIAGNOSIS — F32A Depression, unspecified: Secondary | ICD-10-CM

## 2021-01-28 DIAGNOSIS — H938X2 Other specified disorders of left ear: Secondary | ICD-10-CM

## 2021-01-28 DIAGNOSIS — G2581 Restless legs syndrome: Secondary | ICD-10-CM

## 2021-01-28 MED ORDER — PREDNISONE 20 MG PO TABS
ORAL_TABLET | ORAL | 0 refills | Status: DC
Start: 2021-01-28 — End: 2021-02-12

## 2021-01-28 MED ORDER — GABAPENTIN 600 MG PO TABS: 600.0000 mg | ORAL_TABLET | Freq: Two times a day (BID) | ORAL | 0 refills | Status: DC

## 2021-01-28 NOTE — Telephone Encounter (Signed)
Do you need to see in office?   

## 2021-02-12 ENCOUNTER — Other Ambulatory Visit: Payer: Self-pay

## 2021-02-12 ENCOUNTER — Telehealth (INDEPENDENT_AMBULATORY_CARE_PROVIDER_SITE_OTHER): Payer: BC Managed Care – PPO | Admitting: Nurse Practitioner

## 2021-02-12 VITALS — HR 95 | Temp 98.9°F

## 2021-02-12 DIAGNOSIS — U071 COVID-19: Secondary | ICD-10-CM

## 2021-02-12 HISTORY — DX: COVID-19: U07.1

## 2021-02-12 MED ORDER — NIRMATRELVIR/RITONAVIR (PAXLOVID)TABLET: 3.0000 | ORAL_TABLET | Freq: Two times a day (BID) | ORAL | 0 refills | Status: AC

## 2021-02-12 NOTE — Assessment & Plan Note (Signed)
Discussed antivirals of the ear are EUA only.  Discussed patient needing to hold atorvastatin while taking pack Slo-Bid.  She acknowledged and agreed also told her to watch out for symptoms of lightheaded dizziness as it can increase the hypotensive effect of HCTZ.  Given patient's medications through a drug drug interaction check kidney function within the last 6 months GFR of 93 previous to GFR 80 and above.  Did review signs and symptoms when to seek urgent or emergent health care.  Did review CDC guidelines in regards to quarantine.

## 2021-02-12 NOTE — Progress Notes (Signed)
Patient ID: Nicole Buchanan, female    DOB: 11-Mar-1955, 65 y.o.   MRN: [id number]  Virtual visit completed through [employer], a video enabled telemedicine application. Due to national recommendations of social distancing due to COVID-19, a virtual visit is felt to be most appropriate for this patient at this time. Reviewed limitations, risks, security and privacy concerns of performing a virtual visit and the availability of in person appointments. I also reviewed that there may be a patient responsible charge related to this service. The patient agreed to proceed.   Patient location: home Provider location: [place] [facility], office Persons participating in this virtual visit: patient, provider   If any vitals were documented, they were collected by patient at home unless specified below.    Pulse 95    Temp 98.9 F (37.2 C) Comment: per patient this morning   CC: Covid 19 Subjective:   HPI: Nicole Buchanan is a 65 y.o. female presenting on 02/12/2021 for Covid Positive (On 02/09/21, sx on 12/19/222. Cough, stuffy nose, ears popping, achy feeling, headache, leg calves pain, some sore throat. Had low grade fever-around 100.6 till 02/11/21. )  Symptoms started on 02/09/2021 At home test for covid 02/09/2021 Pfizer x2 and one booster Sister had test positive Has been using thera flu, tylenol cold and flu. Minimal help    Relevant past medical, surgical, family and social history reviewed and updated as indicated. Interim medical history since our last visit reviewed. Allergies and medications reviewed and updated. Outpatient Medications Prior to Visit  Medication Sig Dispense Refill   atorvastatin (LIPITOR) 40 MG tablet TAKE 1 TABLET BY MOUTH ONCE DAILY IN THE EVENING FOR  CHOLESTEROL  -  WILL  NEED  OFFICE  VISIT  FOR  MORE  REFILLS 90 tablet 3   Calcium Carb-Cholecalciferol (CALCIUM 600 + D PO) Take by mouth.     gabapentin (NEURONTIN) 600 MG tablet Take 1 tablet (600 mg  total) by mouth 2 (two) times daily. For restless legs. 180 tablet 0   hydrochlorothiazide (HYDRODIURIL) 25 MG tablet Take 1 tablet (25 mg total) by mouth daily. For blood pressure. 90 tablet 3   lisinopril (ZESTRIL) 5 MG tablet TAKE 1 TABLET BY MOUTH ONCE DAILY FOR BLOOD PRESSURE 90 tablet 3   omeprazole (PRILOSEC) 20 MG capsule Take 20 mg by mouth daily.     venlafaxine XR (EFFEXOR-XR) 150 MG 24 hr capsule TAKE 1 CAPSULE BY MOUTH ONCE DAILY WITH BREAKFAST FOR ANXIETY AND FOR DEPRESSION 90 capsule 1   aspirin EC 81 MG tablet Take by mouth. (Patient not taking: Reported on 01/21/2021)     predniSONE (DELTASONE) 20 MG tablet Take 2 tablets by mouth once daily for 5 days. 10 tablet 0   No facility-administered medications prior to visit.     Per HPI unless specifically indicated in ROS section below Review of Systems  Constitutional:  Positive for chills, fatigue and fever.  HENT:  Positive for congestion, ear pain (full/fluid), sinus pressure, sinus pain and sore throat (improved).   Respiratory:  Positive for cough and shortness of breath.   Cardiovascular:  Negative for chest pain.  Gastrointestinal:  Positive for nausea. Negative for abdominal pain, diarrhea and vomiting.  Musculoskeletal:  Positive for arthralgias and myalgias.  Neurological:  Positive for headaches.  Objective:  Pulse 95    Temp 98.9 F (37.2 C) Comment: per patient this morning  Wt Readings from Last 3 Encounters:  01/21/21 145 lb (65.8 kg)  07/02/20  148 lb (67.1 kg)  07/27/19 140 lb 8 oz (63.7 kg)       Physical exam: Gen: alert, NAD, not ill appearing Pulm: speaks in complete sentences without increased work of breathing Psych: normal mood, normal thought content      Results for orders placed or performed in visit on 07/02/20  Lipid panel  Result Value Ref Range   Cholesterol 211 (H) 0 - 200 mg/dL   Triglycerides 027.7 (H) 0.0 - 149.0 mg/dL   HDL 41.28 >78.67 mg/dL   VLDL 67.2 (H) 0.0 - 09.4 mg/dL    Total CHOL/HDL Ratio 4    NonHDL 157.05   Hemoglobin A1c  Result Value Ref Range   Hgb A1c MFr Bld 5.6 4.6 - 6.5 %  Comprehensive metabolic panel  Result Value Ref Range   Sodium 140 135 - 145 mEq/L   Potassium 3.6 3.5 - 5.1 mEq/L   Chloride 102 96 - 112 mEq/L   CO2 31 19 - 32 mEq/L   Glucose, Bld 85 70 - 99 mg/dL   BUN 17 6 - 23 mg/dL   Creatinine, Ser 7.09 0.40 - 1.20 mg/dL   Total Bilirubin 0.7 0.2 - 1.2 mg/dL   Alkaline Phosphatase 83 39 - 117 U/L   AST 22 0 - 37 U/L   ALT 35 0 - 35 U/L   Total Protein 6.9 6.0 - 8.3 g/dL   Albumin 4.5 3.5 - 5.2 g/dL   GFR 62.83 >66.29 mL/min   Calcium 9.8 8.4 - 10.5 mg/dL  CBC  Result Value Ref Range   WBC 5.6 4.0 - 10.5 K/uL   RBC 4.54 3.87 - 5.11 Mil/uL   Platelets 235.0 150.0 - 400.0 K/uL   Hemoglobin 15.1 (H) 12.0 - 15.0 g/dL   HCT 47.6 54.6 - 50.3 %   MCV 94.5 78.0 - 100.0 fl   MCHC 35.2 30.0 - 36.0 g/dL   RDW 54.6 56.8 - 12.7 %  LDL cholesterol, direct  Result Value Ref Range   Direct LDL 102.0 mg/dL   Assessment & Plan:   Problem List Items Addressed This Visit       Other   COVID-19 - Primary    Discussed antivirals of the ear are EUA only.  Discussed patient needing to hold atorvastatin while taking pack Slo-Bid.  She acknowledged and agreed also told her to watch out for symptoms of lightheaded dizziness as it can increase the hypotensive effect of HCTZ.  Given patient's medications through a drug drug interaction check kidney function within the last 6 months GFR of 93 previous to GFR 80 and above.  Did review signs and symptoms when to seek urgent or emergent health care.  Did review CDC guidelines in regards to quarantine.      Relevant Medications   nirmatrelvir/ritonavir EUA (PAXLOVID) 20 x 150 MG & 10 x 100MG  TABS     No orders of the defined types were placed in this encounter.  No orders of the defined types were placed in this encounter.   I discussed the assessment and treatment plan with the patient. The  patient was provided an opportunity to ask questions and all were answered. The patient agreed with the plan and demonstrated an understanding of the instructions. The patient was advised to call back or seek an in-person evaluation if the symptoms worsen or if the condition fails to improve as anticipated.  Follow up plan: No follow-ups on file.  , NP

## 2021-04-09 ENCOUNTER — Other Ambulatory Visit: Payer: Self-pay | Admitting: Primary Care

## 2021-04-09 DIAGNOSIS — Z1231 Encounter for screening mammogram for malignant neoplasm of breast: Secondary | ICD-10-CM

## 2021-04-14 ENCOUNTER — Other Ambulatory Visit: Payer: Self-pay

## 2021-04-14 ENCOUNTER — Ambulatory Visit
Admission: RE | Admit: 2021-04-14 | Discharge: 2021-04-14 | Disposition: A | Discharge status: Home / Self Care | Payer: BC Managed Care – PPO | Source: Ambulatory Visit | Attending: Primary Care | Admitting: Primary Care

## 2021-04-14 DIAGNOSIS — Z1231 Encounter for screening mammogram for malignant neoplasm of breast: Secondary | ICD-10-CM | POA: Insufficient documentation

## 2021-04-16 ENCOUNTER — Other Ambulatory Visit: Payer: Self-pay | Admitting: Primary Care

## 2021-04-16 DIAGNOSIS — R928 Other abnormal and inconclusive findings on diagnostic imaging of breast: Secondary | ICD-10-CM

## 2021-04-16 DIAGNOSIS — N6489 Other specified disorders of breast: Secondary | ICD-10-CM

## 2021-04-16 DIAGNOSIS — N63 Unspecified lump in unspecified breast: Secondary | ICD-10-CM

## 2021-04-17 ENCOUNTER — Telehealth: Payer: Self-pay | Admitting: Primary Care

## 2021-04-17 NOTE — Telephone Encounter (Signed)
Mrs. Nicole Buchanan called in and stated that she is having a time trying to sign a form that was on mychart.

## 2021-04-20 NOTE — Telephone Encounter (Signed)
Called patient. Patient stated that mychart was asking for a signature for her MM results. She stated she tried doing that but it wouldn't take it. I stated to the patient that I really didn't know much about mychart but she shouldn't need to sign anything and she can call for her appt for the dia MM and [place] she needs to have done. Patient verbalized understanding and will call to set up her appts.

## 2021-04-27 ENCOUNTER — Other Ambulatory Visit: Payer: Self-pay

## 2021-04-27 DIAGNOSIS — E785 Hyperlipidemia, unspecified: Secondary | ICD-10-CM

## 2021-04-27 DIAGNOSIS — F419 Anxiety disorder, unspecified: Secondary | ICD-10-CM

## 2021-04-27 DIAGNOSIS — G2581 Restless legs syndrome: Secondary | ICD-10-CM

## 2021-04-27 DIAGNOSIS — I10 Essential (primary) hypertension: Secondary | ICD-10-CM

## 2021-04-27 MED ORDER — GABAPENTIN 600 MG PO TABS: 600.0000 mg | ORAL_TABLET | Freq: Two times a day (BID) | ORAL | 0 refills | Status: DC

## 2021-04-27 NOTE — Telephone Encounter (Signed)
Which mail order pharmacy?

## 2021-04-27 NOTE — Telephone Encounter (Signed)
Patient has to change to Mail order. Has CPE appointment on 07/16/21. Will need 90 day of all script sent. Not able to get at local pharmacy. She also wanted to know if she can get the combo of the Lisinopril and HCTZ. She did just get 30 day supply from local but that is all insurance will allow her. She will not be out of medication for a few weeks.  ?

## 2021-05-01 MED ORDER — VENLAFAXINE HCL ER 150 MG PO CP24: ORAL_CAPSULE | ORAL | 0 refills | Status: DC

## 2021-05-01 MED ORDER — LISINOPRIL 5 MG PO TABS: ORAL_TABLET | ORAL | 0 refills | Status: DC

## 2021-05-01 MED ORDER — HYDROCHLOROTHIAZIDE 25 MG PO TABS: 25.0000 mg | ORAL_TABLET | Freq: Every day | ORAL | 0 refills | Status: DC

## 2021-05-01 NOTE — Telephone Encounter (Signed)
Mail order [facility]  ?

## 2021-05-01 NOTE — Telephone Encounter (Signed)
Noted. ? ?Unfortunately the lisinopril-hydrochlorothiazide combo does not come in that strength. ? ?Refill sent to mail order pharmacy, [facility], per patient request. ?

## 2021-05-05 ENCOUNTER — Ambulatory Visit
Admission: RE | Admit: 2021-05-05 | Discharge: 2021-05-05 | Disposition: A | Discharge status: Home / Self Care | Payer: BC Managed Care – PPO | Source: Ambulatory Visit | Attending: Primary Care | Admitting: Primary Care

## 2021-05-05 ENCOUNTER — Other Ambulatory Visit: Payer: Self-pay

## 2021-05-05 DIAGNOSIS — N6489 Other specified disorders of breast: Secondary | ICD-10-CM | POA: Insufficient documentation

## 2021-05-05 DIAGNOSIS — N63 Unspecified lump in unspecified breast: Secondary | ICD-10-CM | POA: Insufficient documentation

## 2021-05-05 DIAGNOSIS — R928 Other abnormal and inconclusive findings on diagnostic imaging of breast: Secondary | ICD-10-CM | POA: Insufficient documentation

## 2021-05-11 ENCOUNTER — Other Ambulatory Visit: Payer: Self-pay

## 2021-05-11 DIAGNOSIS — E785 Hyperlipidemia, unspecified: Secondary | ICD-10-CM

## 2021-05-11 DIAGNOSIS — G2581 Restless legs syndrome: Secondary | ICD-10-CM

## 2021-05-11 MED ORDER — GABAPENTIN 600 MG PO TABS: 600.0000 mg | ORAL_TABLET | Freq: Two times a day (BID) | ORAL | 0 refills | Status: DC

## 2021-05-11 MED ORDER — ATORVASTATIN CALCIUM 40 MG PO TABS: ORAL_TABLET | ORAL | 0 refills | Status: DC

## 2021-05-11 NOTE — Telephone Encounter (Signed)
Refills sent to pharmacy. 

## 2021-07-01 ENCOUNTER — Encounter: Payer: Self-pay | Admitting: Family Medicine

## 2021-07-01 ENCOUNTER — Ambulatory Visit: Payer: BC Managed Care – PPO | Admitting: Family Medicine

## 2021-07-01 DIAGNOSIS — H532 Diplopia: Secondary | ICD-10-CM

## 2021-07-01 NOTE — Telephone Encounter (Signed)
Patent added to tower today  ?

## 2021-07-01 NOTE — Patient Instructions (Addendum)
Use artificial tears if needed  ? ?Keep track of your symptoms  ?If episodes get more frequent or longer or if you have pain or any new symptoms let us know  ? ?I placed a neurology referral  ?If you don't hear back in 1-2 weeks please call us  ? ? ? ?

## 2021-07-01 NOTE — Telephone Encounter (Signed)
Yes, needs evaluation ASAP. ?

## 2021-07-01 NOTE — Assessment & Plan Note (Signed)
For several days brief spells (seconds) no more than once a day at different times and no triggers ?Had entirely nl oph exam/visit (per pt) ?No h/o neuro problems ?Nl exam and neuro exam today  ?vision is normal during visit  ?Reassuring  ?Neurology referral done  ?Will continue to monitor closely ? ?ER precautions noted- watching for cva symptoms  ?inst to alert [place] if symptoms change or worsen or if new ones  ?

## 2021-07-01 NOTE — Progress Notes (Signed)
? ?Subjective:  ? ? Patient ID: Nicole Buchanan, female    DOB: [birth year], 66 y.o.   MRN: [id number] ? ?HPI ?66 yo pt of NP [facility] presents with c/o double vision  ? ?Wt Readings from Last 3 Encounters:  ?07/01/21 147 lb (66.7 kg)  ?01/21/21 145 lb (65.8 kg)  ?07/02/20 148 lb (67.1 kg)  ? ?28.24 kg/m? ? ? ?Started on Monday  ?Both eyes equally  ?If she closes one eye however -the double vision goes away  ? ?Happens for a few seconds at a time (up to 30 seconds max)  ?No particular time of day  ?Followed up with eye doctor and told that everything looked good  ? ? ?Some headaches / occ stress headache  ?No more than usual  ? ? ?Pain : none/occ little pressure  ? ?Dryness -does have dry eyes  ? ?Stress - nothing more than usual  ? ?No head trauma  ? ? ?Photophobia - no sensitivity to light  ? ?BP Readings from Last 3 Encounters:  ?07/01/21 130/80  ?01/21/21 136/78  ?07/02/20 124/62  ? ? ?Patient Active Problem List  ? Diagnosis Date Noted  ? Double vision 07/01/2021  ? COVID-19 02/12/2021  ? Abnormal ear sensation 01/21/2021  ? Herpes zoster 01/21/2021  ? Acute diarrhea 07/27/2019  ? Prediabetes 01/12/2017  ? Preventative health care 01/12/2017  ? Restless leg syndrome 07/23/2016  ? Anxiety and depression 07/23/2016  ? Essential hypertension 07/23/2016  ? Hyperlipidemia 07/23/2016  ? GERD (gastroesophageal reflux disease) 07/23/2016  ? Palpitations 07/23/2016  ? ?Past Medical History:  ?Diagnosis Date  ? Depression   ? GERD (gastroesophageal reflux disease)   ? Hiatal hernia   ? Hyperlipemia   ? Hypertension   ? ?Past Surgical History:  ?Procedure Laterality Date  ? ABDOMINAL HYSTERECTOMY    ? CESAREAN SECTION    ? COLONOSCOPY WITH PROPOFOL N/A 05/03/2018  ? Procedure: COLONOSCOPY WITH PROPOFOL;  Surgeon: Pasty Spillers, MD;  Location: [facility];  Service: Endoscopy;  Laterality: N/A;  ? DORSAL COMPARTMENT RELEASE  06/29/2011  ? Procedure: RELEASE DORSAL COMPARTMENT (DEQUERVAIN);  Surgeon: Nicki Reaper, MD;   Location: [facility];  Service: Orthopedics;  Laterality: Right;  ? TONSILLECTOMY    ? ?Social History  ? ?Tobacco Use  ? Smoking status: Former  ?  Types: Cigarettes  ?  Quit date: 06/27/2001  ?  Years since quitting: 20.0  ? Smokeless tobacco: Never  ?Vaping Use  ? Vaping Use: Never used  ?Substance Use Topics  ? Alcohol use: Yes  ? Drug use: No  ? ?Family History  ?Problem Relation Age of Onset  ? Breast cancer Cousin   ?     maternal side -50's  ? COPD Father   ? ?Allergies  ?Allergen Reactions  ? Codeine Nausea And Vomiting  ? Penicillins   ?  dizzy  ? ?Current Outpatient Medications on File Prior to Visit  ?Medication Sig Dispense Refill  ? aspirin EC 81 MG tablet Take by mouth.    ? atorvastatin (LIPITOR) 40 MG tablet TAKE 1 TABLET BY MOUTH ONCE DAILY IN THE EVENING FOR  CHOLESTEROL 90 tablet 0  ? Calcium Carb-Cholecalciferol (CALCIUM 600 + D PO) Take by mouth.    ? gabapentin (NEURONTIN) 600 MG tablet Take 1 tablet (600 mg total) by mouth 2 (two) times daily. For restless legs. 180 tablet 0  ? hydrochlorothiazide (HYDRODIURIL) 25 MG tablet Take 1 tablet (25 mg total) by mouth daily.  For blood pressure. 90 tablet 0  ? lisinopril (ZESTRIL) 5 MG tablet TAKE 1 TABLET BY MOUTH ONCE DAILY FOR BLOOD PRESSURE 90 tablet 0  ? omeprazole (PRILOSEC) 20 MG capsule Take 20 mg by mouth daily.    ? venlafaxine XR (EFFEXOR-XR) 150 MG 24 hr capsule TAKE 1 CAPSULE BY MOUTH ONCE DAILY WITH BREAKFAST FOR ANXIETY AND FOR DEPRESSION 90 capsule 0  ? ?No current facility-administered medications on file prior to visit.  ?  ? ?Review of Systems  ?Constitutional:  Negative for activity change, appetite change, fatigue, fever and unexpected weight change.  ?HENT:  Negative for congestion, ear pain, rhinorrhea, sinus pressure and sore throat.   ?Eyes:  Positive for visual disturbance. Negative for photophobia, pain, discharge, redness and itching.  ?     Does have dry eyes ? ?Wears glasses  ?Not contacts   ?Respiratory:   Negative for cough, shortness of breath and wheezing.   ?Cardiovascular:  Negative for chest pain and palpitations.  ?Gastrointestinal:  Negative for abdominal pain, blood in stool, constipation and diarrhea.  ?Endocrine: Negative for polydipsia and polyuria.  ?Genitourinary:  Negative for dysuria, frequency and urgency.  ?Musculoskeletal:  Negative for arthralgias, back pain and myalgias.  ?Skin:  Negative for pallor and rash.  ?Allergic/Immunologic: Negative for environmental allergies.  ?Neurological:  Negative for dizziness, tremors, seizures, syncope, facial asymmetry, speech difficulty, weakness, light-headedness, numbness and headaches.  ?Hematological:  Negative for adenopathy. Does not bruise/bleed easily.  ?Psychiatric/Behavioral:  Negative for decreased concentration and dysphoric mood. The patient is not nervous/anxious.   ? ?   ?Objective:  ? Physical Exam ?Constitutional:   ?   General: She is not in acute distress. ?   Appearance: Normal appearance. She is well-developed and normal weight. She is not ill-appearing or diaphoretic.  ?HENT:  ?   Head: Normocephalic and atraumatic.  ?   Comments: No facial or temporal tenderness  ?   Right Ear: Tympanic membrane, ear canal and external ear normal.  ?   Left Ear: Tympanic membrane, ear canal and external ear normal.  ?   Nose: Nose normal.  ?   Mouth/Throat:  ?   Mouth: Mucous membranes are moist.  ?   Pharynx: No oropharyngeal exudate.  ?Eyes:  ?   General: Lids are normal. Vision grossly intact. No visual field deficit or scleral icterus.    ?   Right eye: No discharge.     ?   Left eye: No discharge.  ?   Extraocular Movements: Extraocular movements intact.  ?   Right eye: Normal extraocular motion and no nystagmus.  ?   Left eye: Normal extraocular motion and no nystagmus.  ?   Conjunctiva/sclera: Conjunctivae normal.  ?   Right eye: Right conjunctiva is not injected.  ?   Left eye: Left conjunctiva is not injected.  ?   Pupils: Pupils are equal,  round, and reactive to light.  ?   Comments: No nystagmus  ?Neck:  ?   Thyroid: No thyromegaly.  ?   Vascular: No carotid bruit or JVD.  ?   Trachea: No tracheal deviation.  ?Cardiovascular:  ?   Rate and Rhythm: Normal rate and regular rhythm.  ?   Heart sounds: Normal heart sounds. No murmur heard. ?Pulmonary:  ?   Effort: Pulmonary effort is normal. No respiratory distress.  ?   Breath sounds: Normal breath sounds. No wheezing or rales.  ?Abdominal:  ?   General: Bowel sounds are normal. There  is no distension.  ?   Palpations: Abdomen is soft. There is no mass.  ?   Tenderness: There is no abdominal tenderness.  ?Musculoskeletal:     ?   General: No tenderness.  ?   Cervical back: Full passive range of motion without pain, normal range of motion and neck supple.  ?Lymphadenopathy:  ?   Cervical: No cervical adenopathy.  ?Skin: ?   General: Skin is warm and dry.  ?   Coloration: Skin is not pale.  ?   Findings: No rash.  ?Neurological:  ?   Mental Status: She is alert and oriented to person, place, and time.  ?   Cranial Nerves: Cranial nerves 2-12 are intact. No cranial nerve deficit, dysarthria or facial asymmetry.  ?   Sensory: No sensory deficit.  ?   Motor: No weakness, tremor, atrophy, abnormal muscle tone, seizure activity or pronator drift.  ?   Coordination: Romberg sign negative. Coordination normal. Finger-Nose-Finger Test normal.  ?   Gait: Gait is intact. Gait and tandem walk normal.  ?   Deep Tendon Reflexes: Reflexes are normal and symmetric.  ?   Comments: No focal cerebellar signs   ?Psychiatric:     ?   Mood and Affect: Mood normal.     ?   Behavior: Behavior normal.     ?   Thought Content: Thought content normal.  ? ? ? ? ? ?   ?Assessment & Plan:  ? ?Problem List Items Addressed This Visit   ? ?  ? Other  ? Double vision  ?  For several days brief spells (seconds) no more than once a day at different times and no triggers ?Had entirely nl oph exam/visit (per pt) ?No h/o neuro problems ?Nl  exam and neuro exam today  ?vision is normal during visit  ?Reassuring  ?Neurology referral done  ?Will continue to monitor closely ? ?ER precautions noted- watching for cva symptoms  ?inst to alert us if symptoms

## 2021-07-13 ENCOUNTER — Other Ambulatory Visit: Payer: Self-pay | Admitting: Primary Care

## 2021-07-13 DIAGNOSIS — F32A Depression, unspecified: Secondary | ICD-10-CM

## 2021-07-13 DIAGNOSIS — F419 Anxiety disorder, unspecified: Secondary | ICD-10-CM

## 2021-07-13 DIAGNOSIS — I10 Essential (primary) hypertension: Secondary | ICD-10-CM

## 2021-07-16 ENCOUNTER — Ambulatory Visit (INDEPENDENT_AMBULATORY_CARE_PROVIDER_SITE_OTHER): Payer: BC Managed Care – PPO | Admitting: Primary Care

## 2021-07-16 ENCOUNTER — Encounter: Payer: Self-pay | Admitting: Primary Care

## 2021-07-16 VITALS — BP 126/78 | HR 82 | Temp 98.3°F | Ht 65.0 in | Wt 145.0 lb

## 2021-07-16 DIAGNOSIS — F419 Anxiety disorder, unspecified: Secondary | ICD-10-CM

## 2021-07-16 DIAGNOSIS — R7303 Prediabetes: Secondary | ICD-10-CM

## 2021-07-16 DIAGNOSIS — I10 Essential (primary) hypertension: Secondary | ICD-10-CM | POA: Diagnosis not present

## 2021-07-16 DIAGNOSIS — K219 Gastro-esophageal reflux disease without esophagitis: Secondary | ICD-10-CM | POA: Diagnosis not present

## 2021-07-16 DIAGNOSIS — H532 Diplopia: Secondary | ICD-10-CM

## 2021-07-16 DIAGNOSIS — Z0000 Encounter for general adult medical examination without abnormal findings: Secondary | ICD-10-CM | POA: Diagnosis not present

## 2021-07-16 DIAGNOSIS — E785 Hyperlipidemia, unspecified: Secondary | ICD-10-CM | POA: Diagnosis not present

## 2021-07-16 DIAGNOSIS — F32A Depression, unspecified: Secondary | ICD-10-CM

## 2021-07-16 DIAGNOSIS — G2581 Restless legs syndrome: Secondary | ICD-10-CM

## 2021-07-16 LAB — COMPREHENSIVE METABOLIC PANEL
ALT: 21 U/L (ref 0–35)
AST: 17 U/L (ref 0–37)
Albumin: 4.5 g/dL (ref 3.5–5.2)
Alkaline Phosphatase: 90 U/L (ref 39–117)
BUN: 15 mg/dL (ref 6–23)
CO2: 32 mEq/L (ref 19–32)
Calcium: 10.5 mg/dL (ref 8.4–10.5)
Chloride: 102 mEq/L (ref 96–112)
Creatinine, Ser: 0.72 mg/dL (ref 0.40–1.20)
GFR: 88.79 mL/min (ref >60.00)
Glucose, Bld: 97 mg/dL (ref 70–99)
Potassium: 3.9 mEq/L (ref 3.5–5.1)
Sodium: 141 mEq/L (ref 135–145)
Total Bilirubin: 0.7 mg/dL (ref 0.2–1.2)
Total Protein: 6.5 g/dL (ref 6.0–8.3)

## 2021-07-16 LAB — TSH: TSH: 1.49 u[IU]/mL (ref 0.35–5.50)

## 2021-07-16 LAB — LIPID PANEL
Cholesterol: 199 mg/dL (ref 0–200)
HDL: 54.6 mg/dL (ref >39.00)
NonHDL: 144.2
Total CHOL/HDL Ratio: 4
Triglycerides: 202 mg/dL — ABNORMAL HIGH (ref 0.0–149.0)
VLDL: 40.4 mg/dL — ABNORMAL HIGH (ref 0.0–40.0)

## 2021-07-16 LAB — LDL CHOLESTEROL, DIRECT: Direct LDL: 107 mg/dL

## 2021-07-16 LAB — HEMOGLOBIN A1C: Hgb A1c MFr Bld: 5.4 % (ref 4.6–6.5)

## 2021-07-16 MED ORDER — GABAPENTIN 600 MG PO TABS: 600.0000 mg | ORAL_TABLET | Freq: Every day | ORAL | 0 refills | Status: DC

## 2021-07-16 NOTE — Progress Notes (Signed)
Subjective:    Patient ID: Nicole Buchanan, female    DOB: Oct 24, 1955, 66 y.o.   MRN: [id number]  HPI  Nicole Buchanan is a very pleasant 66 y.o. female who presents today for complete physical and follow up of chronic conditions.  Immunizations: -Tetanus: 2018 -Influenza: Completed last season  -Covid-19: 3 vaccines  -Shingles: Completed Shingrix   Diet: Fair diet.  Exercise: No regular exercise.  Eye exam: Completes annually  Dental exam: Completes semi-annually   Pap Smear: Hysterectomy Mammogram: Completed in March 2023 Colonoscopy: Completed in 2020, due 2030   BP Readings from Last 3 Encounters:  07/16/21 126/78  07/01/21 130/80  01/21/21 136/78      Review of Systems  Constitutional:  Negative for unexpected weight change.  HENT:  Negative for rhinorrhea.   Eyes:  Positive for visual disturbance.       Double vision  Respiratory:  Negative for cough and shortness of breath.   Cardiovascular:  Negative for chest pain.  Gastrointestinal:  Negative for constipation and diarrhea.  Genitourinary:  Negative for difficulty urinating.  Musculoskeletal:  Negative for arthralgias and myalgias.  Skin:  Negative for rash.  Allergic/Immunologic: Negative for environmental allergies.  Neurological:  Negative for dizziness, numbness and headaches.  Psychiatric/Behavioral:  The patient is not nervous/anxious.         Past Medical History:  Diagnosis Date   Abnormal ear sensation 01/21/2021   Acute diarrhea 07/27/2019   COVID-19 02/12/2021   Depression    GERD (gastroesophageal reflux disease)    Herpes zoster 01/21/2021   Hiatal hernia    Hyperlipemia    Hypertension     Social History   Socioeconomic History   Marital status: Married    Spouse name: Not on file   Number of children: Not on file   Years of education: Not on file   Highest education level: Not on file  Occupational History   Not on file  Tobacco Use   Smoking status: Former    Types:  Cigarettes    Quit date: 06/27/2001    Years since quitting: 20.0   Smokeless tobacco: Never  Vaping Use   Vaping Use: Never used  Substance and Sexual Activity   Alcohol use: Yes   Drug use: No   Sexual activity: Not on file  Other Topics Concern   Not on file  Social History Narrative   Married.   2 children. 1 grandson.   Works as a Agricultural engineer.   Enjoys spending time with family, gardening.   Social Determinants of Health   Financial Resource Strain: Not on file  Food Insecurity: Not on file  Transportation Needs: Not on file  Physical Activity: Not on file  Stress: Not on file  Social Connections: Not on file  Intimate Partner Violence: Not on file    Past Surgical History:  Procedure Laterality Date   ABDOMINAL HYSTERECTOMY     CESAREAN SECTION     COLONOSCOPY WITH PROPOFOL N/A 05/03/2018   Procedure: COLONOSCOPY WITH PROPOFOL;  Surgeon: Virgel Manifold, MD;  Location: [facility];  Service: Endoscopy;  Laterality: N/A;   DORSAL COMPARTMENT RELEASE  06/29/2011   Procedure: RELEASE DORSAL COMPARTMENT (DEQUERVAIN);  Surgeon: Wynonia Sours, MD;  Location: [place];  Service: Orthopedics;  Laterality: Right;   TONSILLECTOMY      Family History  Problem Relation Age of Onset   Breast cancer Cousin        maternal side -24's  COPD Father     Allergies  Allergen Reactions   Codeine Nausea And Vomiting   Penicillins     dizzy    Current Outpatient Medications on File Prior to Visit  Medication Sig Dispense Refill   aspirin EC 81 MG tablet Take by mouth.     atorvastatin (LIPITOR) 40 MG tablet TAKE 1 TABLET BY MOUTH ONCE DAILY IN THE EVENING FOR  CHOLESTEROL 90 tablet 0   Calcium Carb-Cholecalciferol (CALCIUM 600 + D PO) Take by mouth.     hydrochlorothiazide (HYDRODIURIL) 25 MG tablet Take 1 tablet (25 mg total) by mouth daily. For blood pressure. 90 tablet 0   lisinopril (ZESTRIL) 5 MG tablet TAKE 1 TABLET BY MOUTH ONCE DAILY FOR BLOOD  PRESSURE 90 tablet 0   omeprazole (PRILOSEC) 20 MG capsule Take 20 mg by mouth daily.     venlafaxine XR (EFFEXOR-XR) 150 MG 24 hr capsule TAKE 1 CAPSULE BY MOUTH ONCE DAILY WITH BREAKFAST FOR ANXIETY AND FOR DEPRESSION 90 capsule 0   No current facility-administered medications on file prior to visit.    BP 126/78   Pulse 82   Temp 98.3 F (36.8 C) (Oral)   Ht 5\' 5"  (1.651 m)   Wt 145 lb (65.8 kg)   SpO2 95%   BMI 24.13 kg/m  Objective:   Physical Exam HENT:     Right Ear: Tympanic membrane and ear canal normal.     Left Ear: Tympanic membrane and ear canal normal.     Nose: Nose normal.  Eyes:     Conjunctiva/sclera: Conjunctivae normal.     Pupils: Pupils are equal, round, and reactive to light.  Neck:     Thyroid: No thyromegaly.  Cardiovascular:     Rate and Rhythm: Normal rate and regular rhythm.     Heart sounds: No murmur heard. Pulmonary:     Effort: Pulmonary effort is normal.     Breath sounds: Normal breath sounds. No rales.  Abdominal:     General: Bowel sounds are normal.     Palpations: Abdomen is soft.     Tenderness: There is no abdominal tenderness.  Musculoskeletal:        General: Normal range of motion.     Cervical back: Neck supple.  Lymphadenopathy:     Cervical: No cervical adenopathy.  Skin:    General: Skin is warm and dry.     Findings: No rash.  Neurological:     Mental Status: She is alert and oriented to person, place, and time.     Cranial Nerves: No cranial nerve deficit.     Deep Tendon Reflexes: Reflexes are normal and symmetric.  Psychiatric:        Mood and Affect: Mood normal.          Assessment & Plan:

## 2021-07-16 NOTE — Assessment & Plan Note (Signed)
Controlled.   Continue omeprazole 20 mg daily. 

## 2021-07-16 NOTE — Assessment & Plan Note (Signed)
Chronic, overall controlled.  Continue gabapentin 600 mg HS.

## 2021-07-16 NOTE — Assessment & Plan Note (Signed)
Acute, continued. Follow up with neurology as scheduled.

## 2021-07-16 NOTE — Assessment & Plan Note (Signed)
Discussed the importance of a healthy diet and regular exercise in order for weight loss, and to reduce the risk of further co-morbidity. ? ?Repeat A1C pending. ?

## 2021-07-16 NOTE — Assessment & Plan Note (Signed)
Repeat lipid panel pending. Continue atorvastatin 40 mg daily. 

## 2021-07-16 NOTE — Assessment & Plan Note (Signed)
Controlled.  Continue venlafaxine ER 150 mg daily. 

## 2021-07-16 NOTE — Assessment & Plan Note (Signed)
Controlled.   Continue lisinopril 5 mg daily, HCTZ 25 mg daily. CMP pending.

## 2021-07-16 NOTE — Patient Instructions (Signed)
Stop by the lab prior to leaving today. I will notify you of your results once received.   Follow up with neurology as scheduled.  It was a pleasure to see you today!  Preventive Care 42-64 Years Old, Female Preventive care refers to lifestyle choices and visits with your health care provider that can promote health and wellness. Preventive care visits are also called wellness exams. What can I expect for my preventive care visit? Counseling Your health care provider may ask you questions about your: Medical history, including: Past medical problems. Family medical history. Pregnancy history. Current health, including: Menstrual cycle. Method of birth control. Emotional well-being. Home life and relationship well-being. Sexual activity and sexual health. Lifestyle, including: Alcohol, nicotine or tobacco, and drug use. Access to firearms. Diet, exercise, and sleep habits. Work and work Statistician. Sunscreen use. Safety issues such as seatbelt and bike helmet use. Physical exam Your health care provider will check your: Height and weight. These may be used to calculate your BMI (body mass index). BMI is a measurement that tells if you are at a healthy weight. Waist circumference. This measures the distance around your waistline. This measurement also tells if you are at a healthy weight and may help predict your risk of certain diseases, such as type 2 diabetes and high blood pressure. Heart rate and blood pressure. Body temperature. Skin for abnormal spots. What immunizations do I need?  Vaccines are usually given at various ages, according to a schedule. Your health care provider will recommend vaccines for you based on your age, medical history, and lifestyle or other factors, such as travel or where you work. What tests do I need? Screening Your health care provider may recommend screening tests for certain conditions. This may include: Lipid and cholesterol  levels. Diabetes screening. This is done by checking your blood sugar (glucose) after you have not eaten for a while (fasting). Pelvic exam and Pap test. Hepatitis B test. Hepatitis C test. HIV (human immunodeficiency virus) test. STI (sexually transmitted infection) testing, if you are at risk. Lung cancer screening. Colorectal cancer screening. Mammogram. Talk with your health care provider about when you should start having regular mammograms. This may depend on whether you have a family history of breast cancer. BRCA-related cancer screening. This may be done if you have a family history of breast, ovarian, tubal, or peritoneal cancers. Bone density scan. This is done to screen for osteoporosis. Talk with your health care provider about your test results, treatment options, and if necessary, the need for more tests. Follow these instructions at home: Eating and drinking  Eat a diet that includes fresh fruits and vegetables, whole grains, lean protein, and low-fat dairy products. Take vitamin and mineral supplements as recommended by your health care provider. Do not drink alcohol if: Your health care provider tells you not to drink. You are pregnant, may be pregnant, or are planning to become pregnant. If you drink alcohol: Limit how much you have to 0-1 drink a day. Know how much alcohol is in your drink. In the [place] one drink equals one 12 oz bottle of beer (355 mL), one 5 oz glass of wine (148 mL), or one 1 oz glass of hard liquor (44 mL). Lifestyle Brush your teeth every morning and night with fluoride toothpaste. Floss one time each day. Exercise for at least 30 minutes 5 or more days each week. Do not use any products that contain nicotine or tobacco. These products include cigarettes, chewing tobacco, and vaping  devices, such as e-cigarettes. If you need help quitting, ask your health care provider. Do not use drugs. If you are sexually active, practice safe sex. Use a  condom or other form of protection to prevent STIs. If you do not wish to become pregnant, use a form of birth control. If you plan to become pregnant, see your health care provider for a prepregnancy visit. Take aspirin only as told by your health care provider. Make sure that you understand how much to take and what form to take. Work with your health care provider to find out whether it is safe and beneficial for you to take aspirin daily. Find healthy ways to manage stress, such as: Meditation, yoga, or listening to music. Journaling. Talking to a trusted person. Spending time with friends and family. Minimize exposure to UV radiation to reduce your risk of skin cancer. Safety Always wear your seat belt while driving or riding in a vehicle. Do not drive: If you have been drinking alcohol. Do not ride with someone who has been drinking. When you are tired or distracted. While texting. If you have been using any mind-altering substances or drugs. Wear a helmet and other protective equipment during sports activities. If you have firearms in your house, make sure you follow all gun safety procedures. Seek help if you have been physically or sexually abused. What's next? Visit your health care provider once a year for an annual wellness visit. Ask your health care provider how often you should have your eyes and teeth checked. Stay up to date on all vaccines. This information is not intended to replace advice given to you by your health care provider. Make sure you discuss any questions you have with your health care provider. Document Revised: 08/06/2020 Document Reviewed: 08/06/2020 Elsevier Patient Education  [place].

## 2021-07-16 NOTE — Assessment & Plan Note (Signed)
Immunizations UTD. Colonoscopy UTD, due 2023. Mammogram UTD.  Discussed the importance of a healthy diet and regular exercise in order for weight loss, and to reduce the risk of further co-morbidity.  Exam stable. Labs pending.  Follow up in 1 year for repeat physical.

## 2021-07-22 ENCOUNTER — Other Ambulatory Visit: Payer: Self-pay | Admitting: Primary Care

## 2021-07-22 DIAGNOSIS — E785 Hyperlipidemia, unspecified: Secondary | ICD-10-CM

## 2021-07-28 ENCOUNTER — Ambulatory Visit: Payer: Self-pay | Admitting: Neurology

## 2021-08-06 ENCOUNTER — Encounter: Payer: Self-pay | Admitting: Family Medicine

## 2021-08-06 NOTE — Telephone Encounter (Signed)
Left message to return call to our office.  

## 2021-08-06 NOTE — Telephone Encounter (Signed)
Called patient let her know that you are out of the office until Monday. She would like to have referral changed to [facility] clinic. Patient denies any changes in symptoms. I have reviewed all red words with her and if any she will go to ED or call 911. She will let us know if any further questions.

## 2021-08-06 NOTE — Telephone Encounter (Signed)
Patient returned phone call. °

## 2021-09-17 DIAGNOSIS — H532 Diplopia: Secondary | ICD-10-CM | POA: Diagnosis not present

## 2021-09-17 DIAGNOSIS — R519 Headache, unspecified: Secondary | ICD-10-CM | POA: Diagnosis not present

## 2021-09-17 DIAGNOSIS — H579 Unspecified disorder of eye and adnexa: Secondary | ICD-10-CM | POA: Diagnosis not present

## 2021-09-17 DIAGNOSIS — R42 Dizziness and giddiness: Secondary | ICD-10-CM | POA: Diagnosis not present

## 2021-10-05 ENCOUNTER — Other Ambulatory Visit: Payer: Self-pay | Admitting: Physician Assistant

## 2021-10-05 DIAGNOSIS — R519 Headache, unspecified: Secondary | ICD-10-CM

## 2021-10-05 DIAGNOSIS — H579 Unspecified disorder of eye and adnexa: Secondary | ICD-10-CM

## 2021-10-05 DIAGNOSIS — H532 Diplopia: Secondary | ICD-10-CM

## 2021-10-05 DIAGNOSIS — R42 Dizziness and giddiness: Secondary | ICD-10-CM

## 2021-10-16 ENCOUNTER — Ambulatory Visit
Admission: RE | Admit: 2021-10-16 | Discharge: 2021-10-16 | Disposition: A | Discharge status: Home / Self Care | Payer: BC Managed Care – PPO | Source: Ambulatory Visit | Attending: Physician Assistant | Admitting: Physician Assistant

## 2021-10-16 DIAGNOSIS — H532 Diplopia: Secondary | ICD-10-CM | POA: Diagnosis not present

## 2021-10-16 DIAGNOSIS — R42 Dizziness and giddiness: Secondary | ICD-10-CM

## 2021-10-16 DIAGNOSIS — H579 Unspecified disorder of eye and adnexa: Secondary | ICD-10-CM | POA: Diagnosis not present

## 2021-10-16 DIAGNOSIS — R519 Headache, unspecified: Secondary | ICD-10-CM

## 2021-10-16 MED ORDER — GADOBENATE DIMEGLUMINE 529 MG/ML IV SOLN
13.0000 mL | Freq: Once | INTRAVENOUS | Status: AC | PRN
  Administered 2021-10-16: 13 mL via INTRAVENOUS

## 2021-10-27 DIAGNOSIS — R42 Dizziness and giddiness: Secondary | ICD-10-CM | POA: Diagnosis not present

## 2021-10-29 DIAGNOSIS — H532 Diplopia: Secondary | ICD-10-CM | POA: Diagnosis not present

## 2021-10-29 DIAGNOSIS — E538 Deficiency of other specified B group vitamins: Secondary | ICD-10-CM | POA: Diagnosis not present

## 2021-10-29 DIAGNOSIS — R42 Dizziness and giddiness: Secondary | ICD-10-CM | POA: Diagnosis not present

## 2021-10-29 DIAGNOSIS — H579 Unspecified disorder of eye and adnexa: Secondary | ICD-10-CM | POA: Diagnosis not present

## 2021-10-29 DIAGNOSIS — R519 Headache, unspecified: Secondary | ICD-10-CM | POA: Diagnosis not present

## 2022-05-17 ENCOUNTER — Other Ambulatory Visit: Payer: Self-pay | Admitting: Primary Care

## 2022-05-17 DIAGNOSIS — F32A Depression, unspecified: Secondary | ICD-10-CM

## 2022-05-17 DIAGNOSIS — E785 Hyperlipidemia, unspecified: Secondary | ICD-10-CM

## 2022-05-17 DIAGNOSIS — I10 Essential (primary) hypertension: Secondary | ICD-10-CM

## 2022-05-17 NOTE — Telephone Encounter (Signed)
Patient has been scheduled

## 2022-05-17 NOTE — Telephone Encounter (Signed)
Patient is due for CPE/follow up in late May, this will be required prior to any further refills.  Please schedule, thank you!   

## 2022-05-26 IMAGING — MG MM DIGITAL DIAGNOSTIC UNILAT*L* W/ TOMO W/ CAD
8 series · 8 of 24 positions shown · non-contrast
Comparison: Previous exam(s).

CLINICAL DATA: 65-year-old female for further evaluation of
possible LEFT breast mass and possible LEFT breast asymmetry on
screening mammogram.

EXAM:
DIGITAL DIAGNOSTIC UNILATERAL LEFT MAMMOGRAM WITH TOMOSYNTHESIS AND
CAD; ULTRASOUND LEFT BREAST LIMITED
TECHNIQUE: Left digital diagnostic mammography and breast tomosynthesis was
performed. The images were evaluated with computer-aided detection.;
Targeted ultrasound examination of the left breast was performed.

[L CC synth-2D (1 of 2)]
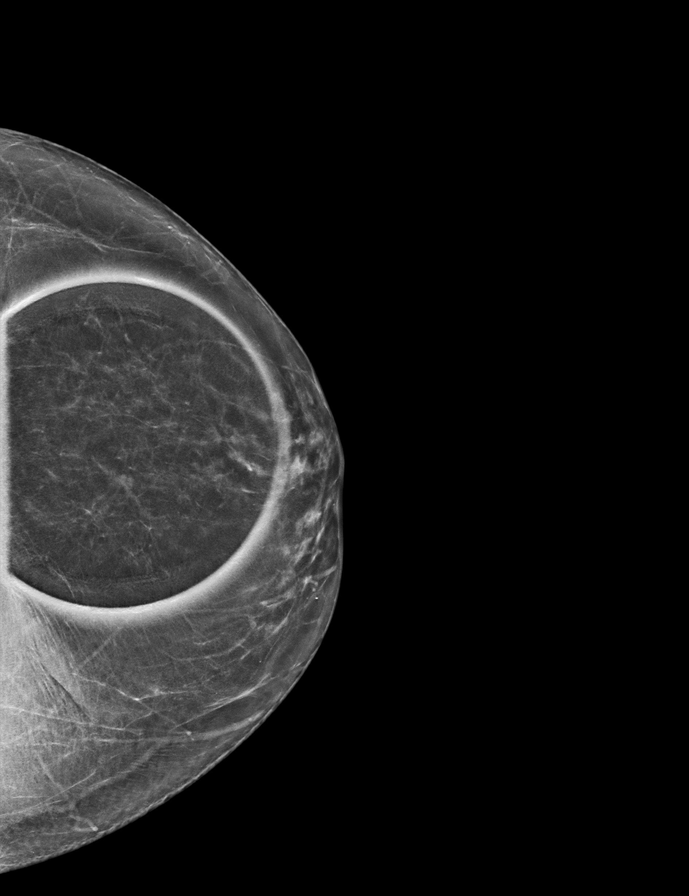

[L CC synth-2D (2 of 2)]
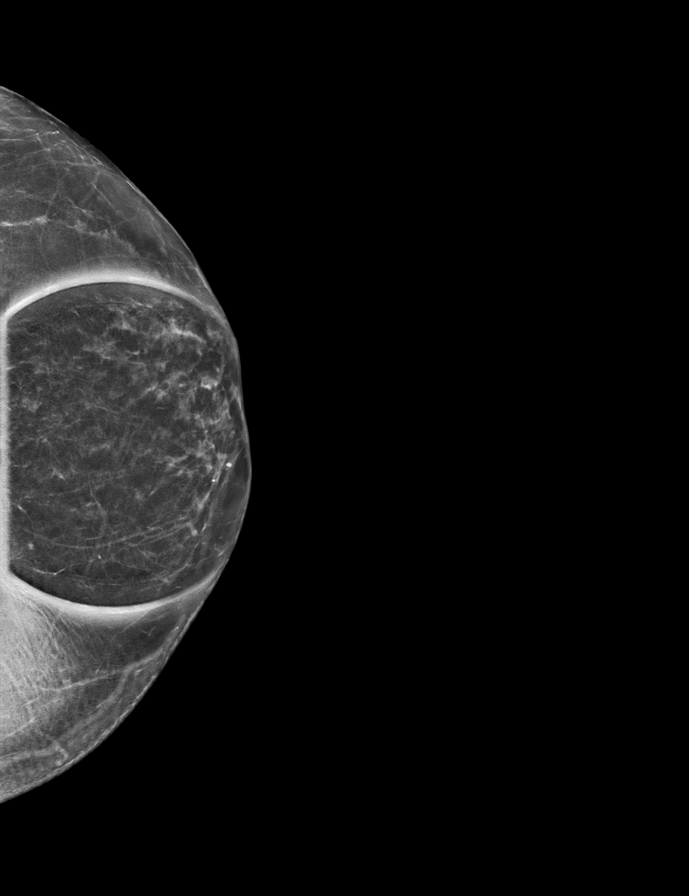

[L MLO synth-2D]
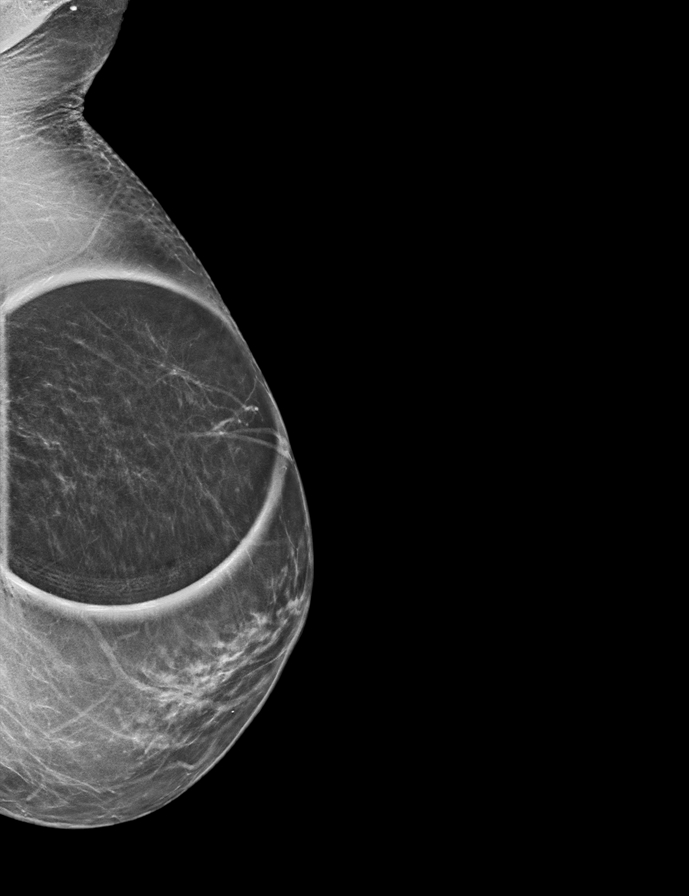

[L ML synth-2D]
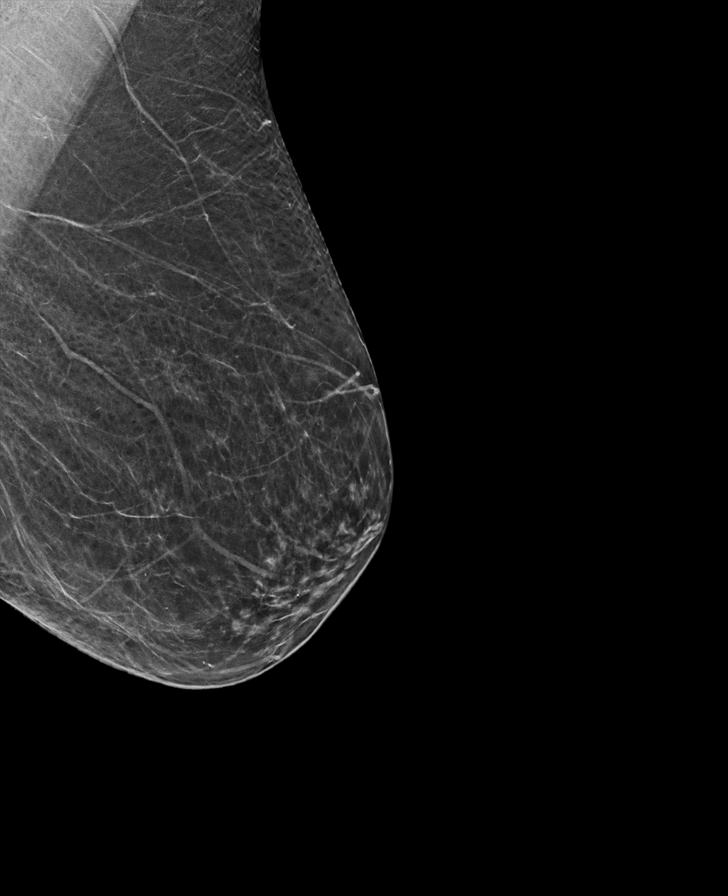

[L ML tomo · tomo slice 32/63.0]
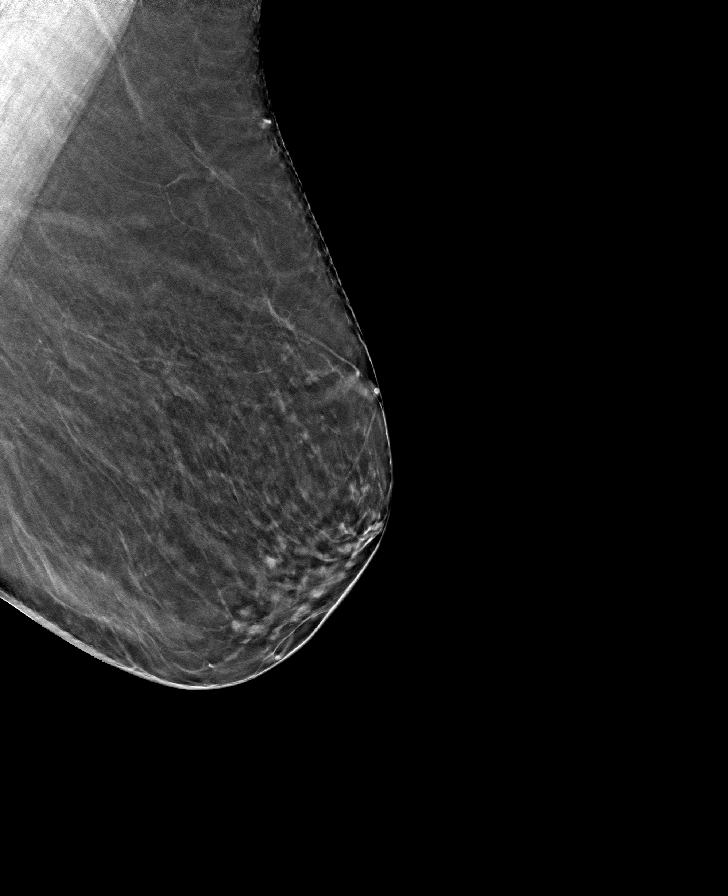

[L MLO tomo · tomo slice 27/53.0]
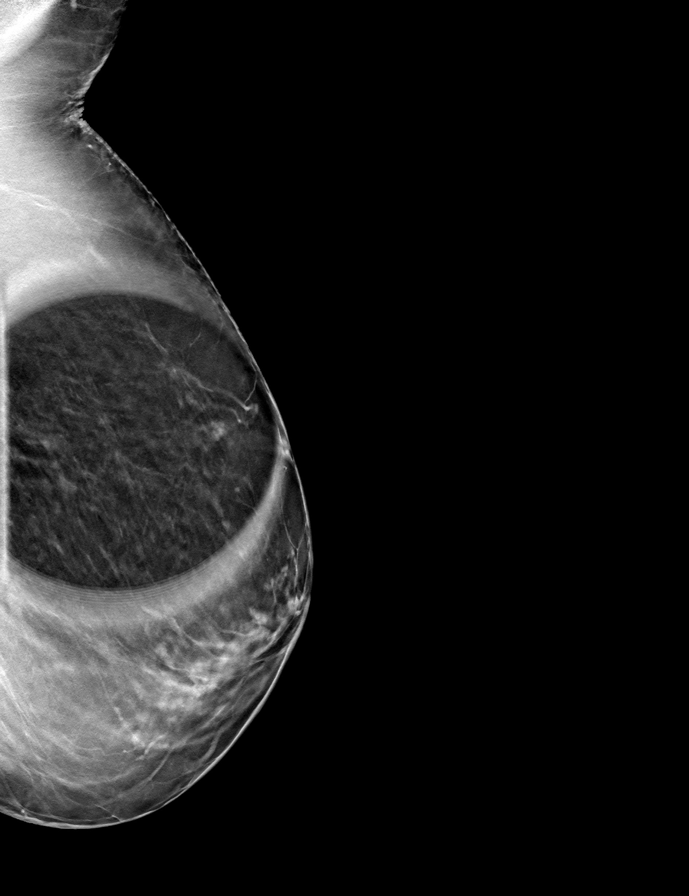

[L CC tomo (1 of 2) · tomo slice 25/50.0]
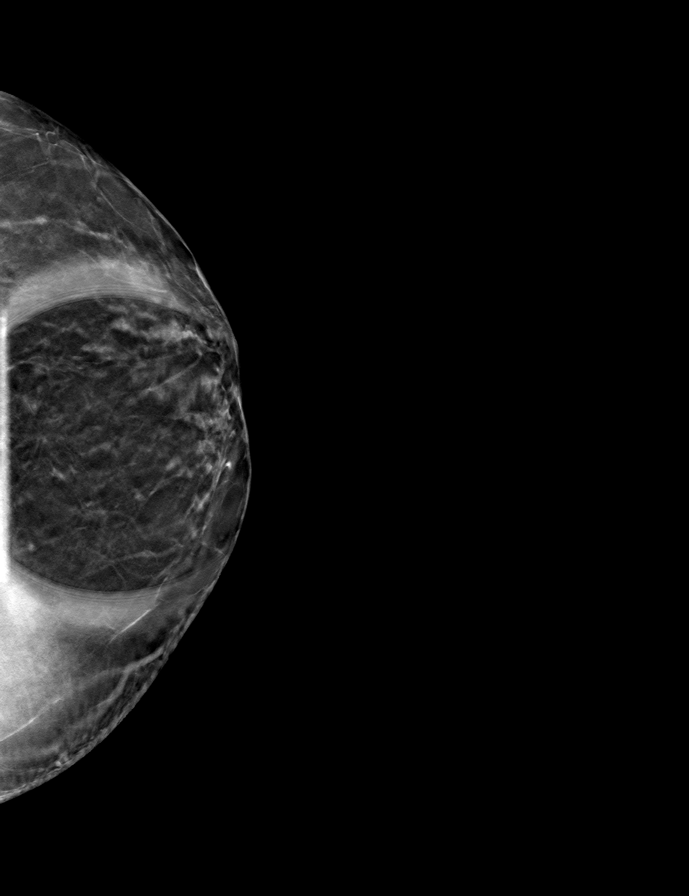

[L CC tomo (2 of 2) · tomo slice 27/52.0]
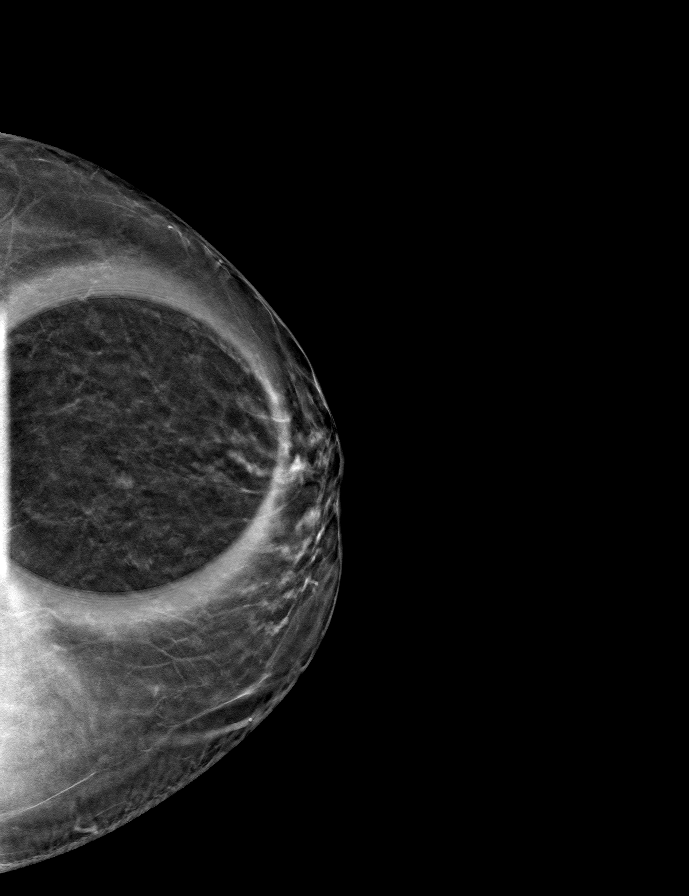

[8 of 24 positions shown; findings below may reference images not displayed]

ACR Breast Density Category b: There are scattered areas of
fibroglandular density.
FINDINGS: 2D/3D full field and spot compression views of the LEFT breast
demonstrate a persistent 0.3 cm circumscribed oval mass within the
UPPER LEFT breast, anterior to middle depth. No other persistent
mammographic abnormalities are noted.

Targeted ultrasound is performed, showing a 0.3 x 0.3 cm benign
complicated cyst at the [DATE] position of the LEFT breast 4 cm from
the nipple, a good correlate to the screening study finding. No
other abnormalities in the UPPER LEFT breast identified.
IMPRESSION: Benign cyst in the UPPER LEFT breast, a good correlate to the
screening study finding.

RECOMMENDATION:
Bilateral screening mammogram in 1 year.

I have discussed the findings and recommendations with the patient.
If applicable, a reminder letter will be sent to the patient
regarding the next appointment.

BI-RADS CATEGORY  2: Benign.

## 2022-07-06 ENCOUNTER — Ambulatory Visit (INDEPENDENT_AMBULATORY_CARE_PROVIDER_SITE_OTHER): Payer: Medicaid Other | Admitting: Primary Care

## 2022-07-06 ENCOUNTER — Encounter: Payer: Self-pay | Admitting: Primary Care

## 2022-07-06 VITALS — BP 122/76 | HR 76 | Temp 98.2°F | Ht 65.0 in | Wt 138.0 lb

## 2022-07-06 DIAGNOSIS — L237 Allergic contact dermatitis due to plants, except food: Secondary | ICD-10-CM

## 2022-07-06 HISTORY — DX: Allergic contact dermatitis due to plants, except food: L23.7

## 2022-07-06 MED ORDER — PREDNISONE 20 MG PO TABS
ORAL_TABLET | ORAL | 0 refills | Status: DC
Start: 2022-07-06 — End: 2022-07-20

## 2022-07-06 MED ORDER — METHYLPREDNISOLONE ACETATE 80 MG/ML IJ SUSP
80.0000 mg | Freq: Once | INTRAMUSCULAR | Status: AC
Start: 2022-07-06 — End: 2022-07-06
  Administered 2022-07-06: 80 mg via INTRAMUSCULAR

## 2022-07-06 NOTE — Patient Instructions (Signed)
Start prednisone tablets. Take two tablets my mouth once daily in the morning for four days, then one tablet once daily in the morning for four days.   You can use the Cortisone cream as needed.  It was a pleasure to see you today!

## 2022-07-06 NOTE — Progress Notes (Signed)
Subjective:    Patient ID: Nicole Buchanan, female    DOB: [birth year], 67 y.o.   MRN: [id number]  Rash Pertinent negatives include no fever or shortness of breath.    Nicole Buchanan is a very pleasant 67 y.o. female with a histoyr of hypertension, GERD, restless legs, prediabetes who presents today to discuss rash.  Symptom onset four days ago with a red spot to the left lateral neck under the mandible which was itchy. At first she thought it was a mosquito bite. She then developed spots with itching to her fingers, a spot to the left cheek with warmth, additional spots to the left lateral neck, and now a spot to her right forearm.    She was out in yard pulling weeds just prior to symptom onset.   She's taken Benadryl, Cortisone cream with slight and temporary improvement. She denies wheezing, SOB, fevers.     Review of Systems  Constitutional:  Negative for fever.  Respiratory:  Negative for shortness of breath and wheezing.   Skin:  Positive for rash.         Past Medical History:  Diagnosis Date   Abnormal ear sensation 01/21/2021   Acute diarrhea 07/27/2019   COVID-19 02/12/2021   Depression    GERD (gastroesophageal reflux disease)    Herpes zoster 01/21/2021   Hiatal hernia    Hyperlipemia    Hypertension     Social History   Socioeconomic History   Marital status: Married    Spouse name: Not on file   Number of children: Not on file   Years of education: Not on file   Highest education level: Not on file  Occupational History   Not on file  Tobacco Use   Smoking status: Former    Types: Cigarettes    Quit date: 06/27/2001    Years since quitting: 21.0   Smokeless tobacco: Never  Vaping Use   Vaping Use: Never used  Substance and Sexual Activity   Alcohol use: Yes   Drug use: No   Sexual activity: Not on file  Other Topics Concern   Not on file  Social History Narrative   Married.   2 children. 1 grandson.   Works as a Futures trader.   Enjoys  spending time with family, gardening.   Social Determinants of Health   Financial Resource Strain: Not on file  Food Insecurity: Not on file  Transportation Needs: Not on file  Physical Activity: Not on file  Stress: Not on file  Social Connections: Not on file  Intimate Partner Violence: Not on file    Past Surgical History:  Procedure Laterality Date   ABDOMINAL HYSTERECTOMY     CESAREAN SECTION     COLONOSCOPY WITH PROPOFOL N/A 05/03/2018   Procedure: COLONOSCOPY WITH PROPOFOL;  Surgeon: Pasty Spillers, MD;  Location: [facility];  Service: Endoscopy;  Laterality: N/A;   DORSAL COMPARTMENT RELEASE  06/29/2011   Procedure: RELEASE DORSAL COMPARTMENT (DEQUERVAIN);  Surgeon: Nicki Reaper, MD;  Location: [facility];  Service: Orthopedics;  Laterality: Right;   TONSILLECTOMY      Family History  Problem Relation Age of Onset   Breast cancer Cousin        maternal side -50's   COPD Father     Allergies  Allergen Reactions   Codeine Nausea And Vomiting   Penicillins     dizzy    Current Outpatient Medications on File Prior to Visit  Medication Sig  Dispense Refill   aspirin EC 81 MG tablet Take by mouth.     atorvastatin (LIPITOR) 40 MG tablet TAKE 1 TABLET DAILY IN THE EVENING FOR CHOLESTEROL 90 tablet 3   Calcium Carb-Cholecalciferol (CALCIUM 600 + D PO) Take by mouth.     gabapentin (NEURONTIN) 600 MG tablet Take 1 tablet (600 mg total) by mouth at bedtime. For restless legs. 90 tablet 0   hydrochlorothiazide (HYDRODIURIL) 25 MG tablet TAKE 1 TABLET DAILY FOR BLOOD PRESSURE 90 tablet 3   lisinopril (ZESTRIL) 5 MG tablet TAKE 1 TABLET DAILY FOR BLOOD PRESSURE 90 tablet 3   omeprazole (PRILOSEC) 20 MG capsule Take 20 mg by mouth daily.     venlafaxine XR (EFFEXOR-XR) 150 MG 24 hr capsule TAKE 1 CAPSULE DAILY WITH BREAKFAST FOR ANXIETY AND DEPRESSION 90 capsule 3   No current facility-administered medications on file prior to visit.    BP 122/76    Pulse 76   Temp 98.2 F (36.8 C) (Temporal)   Ht 5\' 5"  (1.651 m)   Wt 138 lb (62.6 kg)   SpO2 98%   BMI 22.96 kg/m  Objective:   Physical Exam Constitutional:      General: She is not in acute distress. Cardiovascular:     Rate and Rhythm: Normal rate.  Pulmonary:     Effort: Pulmonary effort is normal.  Skin:    Findings: Rash present.     Comments: Several raised red bumps to left lateral neck under mandible, right anterior forearm, bilateral dorsal hands.            Assessment & Plan:  Poison ivy dermatitis Assessment & Plan: Classic presentation today on exam.  Discussed to wash all bed linens and clothing.  IM Depo Medrol 80 mg provided today. Start prednisone tablets. Take two tablets my mouth once daily in the morning for four days, then one tablet once daily in the morning for four days.   Follow up PRN.  Orders: -     predniSONE; Take two tablets my mouth once daily in the morning for four days, then one tablet once daily in the morning for four days.  Dispense: 12 tablet; Refill: 0        Doreene Nest, NP

## 2022-07-06 NOTE — Assessment & Plan Note (Signed)
Classic presentation today on exam.  Discussed to wash all bed linens and clothing.  IM Depo Medrol 80 mg provided today. Start prednisone tablets. Take two tablets my mouth once daily in the morning for four days, then one tablet once daily in the morning for four days.   Follow up PRN.

## 2022-07-06 NOTE — Addendum Note (Signed)
Addended by: Lonia Blood on: 07/06/2022 01:05 PM   Modules accepted: Orders

## 2022-07-20 ENCOUNTER — Encounter: Payer: Self-pay | Admitting: Primary Care

## 2022-07-20 ENCOUNTER — Ambulatory Visit (INDEPENDENT_AMBULATORY_CARE_PROVIDER_SITE_OTHER): Payer: Medicaid Other | Admitting: Primary Care

## 2022-07-20 VITALS — BP 122/70 | HR 79 | Temp 97.3°F | Ht 61.0 in | Wt 136.0 lb

## 2022-07-20 DIAGNOSIS — F32A Depression, unspecified: Secondary | ICD-10-CM

## 2022-07-20 DIAGNOSIS — Z0000 Encounter for general adult medical examination without abnormal findings: Secondary | ICD-10-CM

## 2022-07-20 DIAGNOSIS — G2581 Restless legs syndrome: Secondary | ICD-10-CM

## 2022-07-20 DIAGNOSIS — F419 Anxiety disorder, unspecified: Secondary | ICD-10-CM

## 2022-07-20 DIAGNOSIS — K219 Gastro-esophageal reflux disease without esophagitis: Secondary | ICD-10-CM

## 2022-07-20 DIAGNOSIS — E785 Hyperlipidemia, unspecified: Secondary | ICD-10-CM | POA: Diagnosis not present

## 2022-07-20 DIAGNOSIS — Z1231 Encounter for screening mammogram for malignant neoplasm of breast: Secondary | ICD-10-CM

## 2022-07-20 DIAGNOSIS — I10 Essential (primary) hypertension: Secondary | ICD-10-CM | POA: Diagnosis not present

## 2022-07-20 DIAGNOSIS — R7303 Prediabetes: Secondary | ICD-10-CM | POA: Diagnosis not present

## 2022-07-20 LAB — LIPID PANEL
Cholesterol: 196 mg/dL (ref 0–200)
HDL: 70.7 mg/dL (ref >39.00)
LDL Cholesterol: 103 mg/dL — ABNORMAL HIGH (ref 0–99)
NonHDL: 125.67
Total CHOL/HDL Ratio: 3
Triglycerides: 114 mg/dL (ref 0.0–149.0)
VLDL: 22.8 mg/dL (ref 0.0–40.0)

## 2022-07-20 LAB — COMPREHENSIVE METABOLIC PANEL
ALT: 21 U/L (ref 0–35)
AST: 19 U/L (ref 0–37)
Albumin: 4.3 g/dL (ref 3.5–5.2)
Alkaline Phosphatase: 77 U/L (ref 39–117)
BUN: 16 mg/dL (ref 6–23)
CO2: 27 mEq/L (ref 19–32)
Calcium: 9.4 mg/dL (ref 8.4–10.5)
Chloride: 100 mEq/L (ref 96–112)
Creatinine, Ser: 0.76 mg/dL (ref 0.40–1.20)
GFR: 82.63 mL/min (ref >60.00)
Glucose, Bld: 80 mg/dL (ref 70–99)
Potassium: 3.4 mEq/L — ABNORMAL LOW (ref 3.5–5.1)
Sodium: 136 mEq/L (ref 135–145)
Total Bilirubin: 0.7 mg/dL (ref 0.2–1.2)
Total Protein: 6.7 g/dL (ref 6.0–8.3)

## 2022-07-20 LAB — HEMOGLOBIN A1C: Hgb A1c MFr Bld: 5.5 % (ref 4.6–6.5)

## 2022-07-20 NOTE — Patient Instructions (Signed)
Stop by the lab prior to leaving today. I will notify you of your results once received.   Call the [facility] to schedule your mammogram.   It was a pleasure to see you today!   

## 2022-07-20 NOTE — Assessment & Plan Note (Signed)
Controlled.   Continue omeprazole 20 mg daily. 

## 2022-07-20 NOTE — Assessment & Plan Note (Signed)
Controlled.  Continue HCTZ 25 mg daily and lisinopril 5 mg daily. CMP pending.

## 2022-07-20 NOTE — Assessment & Plan Note (Signed)
Continue atorvastatin 40 mg daily. Repeat lipid panel pending. 

## 2022-07-20 NOTE — Assessment & Plan Note (Signed)
Repeat A1C pending.  Discussed the importance of a healthy diet and regular exercise in order for weight loss, and to reduce the risk of further co-morbidity.  

## 2022-07-20 NOTE — Assessment & Plan Note (Signed)
Controlled.  Continue gabapentin 600 mg HS. 

## 2022-07-20 NOTE — Assessment & Plan Note (Signed)
Controlled.  Continue Venlafaxine ER 150 mg daily Continue to monitor.

## 2022-07-20 NOTE — Progress Notes (Signed)
Subjective:    Patient ID: Nicole Buchanan, female    DOB: 13-Jul-1955, 67 y.o.   MRN: [id number]  HPI  Nicole Buchanan is a very pleasant 67 y.o. female who presents today for complete physical and follow up of chronic conditions.  Immunizations: -Tetanus: Completed in 2018 -Shingles: Completed Shingrix series  Diet: Fair diet.  Exercise: No regular exercise.  Eye exam: Completes annually  Dental exam: Completes semi-annually    Pap Smear: Hysterectomy Mammogram: March 2023  Colonoscopy: Completed in 2020, due 2030  BP Readings from Last 3 Encounters:  07/20/22 122/70  07/06/22 122/76  07/16/21 126/78       Review of Systems  Constitutional:  Negative for unexpected weight change.  HENT:  Negative for rhinorrhea.   Respiratory:  Negative for cough and shortness of breath.   Cardiovascular:  Negative for chest pain.  Gastrointestinal:  Negative for constipation and diarrhea.  Genitourinary:  Negative for difficulty urinating.  Musculoskeletal:  Positive for arthralgias.  Skin:  Negative for rash.  Allergic/Immunologic: Negative for environmental allergies.  Neurological:  Negative for dizziness, numbness and headaches.  Psychiatric/Behavioral:  The patient is not nervous/anxious.          Past Medical History:  Diagnosis Date   Abnormal ear sensation 01/21/2021   Acute diarrhea 07/27/2019   COVID-19 02/12/2021   Depression    GERD (gastroesophageal reflux disease)    Herpes zoster 01/21/2021   Hiatal hernia    Hyperlipemia    Hypertension    Poison ivy dermatitis 07/06/2022    Social History   Socioeconomic History   Marital status: Married    Spouse name: Not on file   Number of children: Not on file   Years of education: Not on file   Highest education level: Not on file  Occupational History   Not on file  Tobacco Use   Smoking status: Former    Types: Cigarettes    Quit date: 06/27/2001    Years since quitting: 21.0   Smokeless tobacco:  Never  Vaping Use   Vaping Use: Never used  Substance and Sexual Activity   Alcohol use: Yes   Drug use: No   Sexual activity: Not on file  Other Topics Concern   Not on file  Social History Narrative   Married.   2 children. 1 grandson.   Works as a Futures trader.   Enjoys spending time with family, gardening.   Social Determinants of Health   Financial Resource Strain: Not on file  Food Insecurity: Not on file  Transportation Needs: Not on file  Physical Activity: Not on file  Stress: Not on file  Social Connections: Not on file  Intimate Partner Violence: Not on file    Past Surgical History:  Procedure Laterality Date   ABDOMINAL HYSTERECTOMY     CESAREAN SECTION     COLONOSCOPY WITH PROPOFOL N/A 05/03/2018   Procedure: COLONOSCOPY WITH PROPOFOL;  Surgeon: Pasty Spillers, MD;  Location: [facility];  Service: Endoscopy;  Laterality: N/A;   DORSAL COMPARTMENT RELEASE  06/29/2011   Procedure: RELEASE DORSAL COMPARTMENT (DEQUERVAIN);  Surgeon: Nicki Reaper, MD;  Location: [facility];  Service: Orthopedics;  Laterality: Right;   TONSILLECTOMY      Family History  Problem Relation Age of Onset   Breast cancer Cousin        maternal side -50's   COPD Father     Allergies  Allergen Reactions   Codeine Nausea And Vomiting  Penicillins     dizzy    Current Outpatient Medications on File Prior to Visit  Medication Sig Dispense Refill   aspirin EC 81 MG tablet Take by mouth.     atorvastatin (LIPITOR) 40 MG tablet TAKE 1 TABLET DAILY IN THE EVENING FOR CHOLESTEROL 90 tablet 3   Calcium Carb-Cholecalciferol (CALCIUM 600 + D PO) Take by mouth.     gabapentin (NEURONTIN) 600 MG tablet Take 1 tablet (600 mg total) by mouth at bedtime. For restless legs. 90 tablet 0   hydrochlorothiazide (HYDRODIURIL) 25 MG tablet TAKE 1 TABLET DAILY FOR BLOOD PRESSURE 90 tablet 3   lisinopril (ZESTRIL) 5 MG tablet TAKE 1 TABLET DAILY FOR BLOOD PRESSURE 90 tablet 3    omeprazole (PRILOSEC) 20 MG capsule Take 20 mg by mouth daily.     venlafaxine XR (EFFEXOR-XR) 150 MG 24 hr capsule TAKE 1 CAPSULE DAILY WITH BREAKFAST FOR ANXIETY AND DEPRESSION 90 capsule 3   No current facility-administered medications on file prior to visit.    BP 122/70   Pulse 79   Temp (!) 97.3 F (36.3 C) (Temporal)   Ht 5\' 1"  (1.549 m)   Wt 136 lb (61.7 kg)   SpO2 98%   BMI 25.70 kg/m  Objective:   Physical Exam HENT:     Right Ear: Tympanic membrane and ear canal normal.     Left Ear: Tympanic membrane and ear canal normal.     Nose: Nose normal.  Eyes:     Conjunctiva/sclera: Conjunctivae normal.     Pupils: Pupils are equal, round, and reactive to light.  Neck:     Thyroid: No thyromegaly.  Cardiovascular:     Rate and Rhythm: Normal rate and regular rhythm.     Heart sounds: No murmur heard. Pulmonary:     Effort: Pulmonary effort is normal.     Breath sounds: Normal breath sounds. No rales.  Abdominal:     General: Bowel sounds are normal.     Palpations: Abdomen is soft.     Tenderness: There is no abdominal tenderness.  Musculoskeletal:        General: Normal range of motion.     Cervical back: Neck supple.  Lymphadenopathy:     Cervical: No cervical adenopathy.  Skin:    General: Skin is warm and dry.     Findings: No rash.  Neurological:     Mental Status: She is alert and oriented to person, place, and time.     Cranial Nerves: No cranial nerve deficit.     Deep Tendon Reflexes: Reflexes are normal and symmetric.  Psychiatric:        Mood and Affect: Mood normal.           Assessment & Plan:  Preventative health care Assessment & Plan: Immunizations UTD. Mammogram due, orders placed. Colonoscopy UTD, due 2030  Discussed the importance of a healthy diet and regular exercise in order for weight loss, and to reduce the risk of further co-morbidity.  Exam stable. Labs pending.  Follow up in 1 year for repeat  physical.    Essential hypertension Assessment & Plan: Controlled.  Continue HCTZ 25 mg daily and lisinopril 5 mg daily. CMP pending.   Gastroesophageal reflux disease, unspecified whether esophagitis present Assessment & Plan: Controlled.  Continue omeprazole 20 mg daily.   Anxiety and depression Assessment & Plan: Controlled.  Continue Venlafaxine ER 150 mg daily Continue to monitor.    Hyperlipidemia, unspecified hyperlipidemia type Assessment & Plan:  Continue atorvastatin 40 mg daily.  Repeat lipid panel pending.  Orders: -     Lipid panel -     Comprehensive metabolic panel  Prediabetes Assessment & Plan: Repeat A1C pending.  Discussed the importance of a healthy diet and regular exercise in order for weight loss, and to reduce the risk of further co-morbidity.   Orders: -     Hemoglobin A1c  Restless leg syndrome Assessment & Plan: Controlled.  Continue gabapentin 600 mg HS.   Screening mammogram for breast cancer -     3D Screening Mammogram, Left and Right; Future        Doreene Nest, NP

## 2022-07-20 NOTE — Assessment & Plan Note (Signed)
Immunizations UTD. Mammogram due, orders placed. Colonoscopy UTD, due 2030  Discussed the importance of a healthy diet and regular exercise in order for weight loss, and to reduce the risk of further co-morbidity.  Exam stable. Labs pending.  Follow up in 1 year for repeat physical.

## 2022-08-09 ENCOUNTER — Telehealth: Payer: Self-pay | Admitting: Primary Care

## 2022-08-09 ENCOUNTER — Other Ambulatory Visit: Payer: Self-pay | Admitting: Primary Care

## 2022-08-09 DIAGNOSIS — I10 Essential (primary) hypertension: Secondary | ICD-10-CM

## 2022-08-09 DIAGNOSIS — E785 Hyperlipidemia, unspecified: Secondary | ICD-10-CM

## 2022-08-09 DIAGNOSIS — F32A Depression, unspecified: Secondary | ICD-10-CM

## 2022-08-09 DIAGNOSIS — G2581 Restless legs syndrome: Secondary | ICD-10-CM

## 2022-08-09 MED ORDER — GABAPENTIN 600 MG PO TABS: 600.0000 mg | ORAL_TABLET | Freq: Every day | ORAL | 2 refills | Status: DC

## 2022-08-09 NOTE — Telephone Encounter (Signed)
Prescription Request  08/09/2022  LOV: 07/20/2022  What is the name of the medication or equipment?  gabapentin (NEURONTIN) 600 MG tablet  Have you contacted your pharmacy to request a refill? Yes   Which pharmacy would you like this sent to?    [facility] - [place], [place] - [address] [place] [address] Phone: [phone] Fax: [phone]    Patient notified that their request is being sent to the clinical staff for review and that they should receive a response within 2 business days.   Please advise at [facility] [phone]

## 2022-08-09 NOTE — Telephone Encounter (Signed)
Requested Prescriptions   Signed Prescriptions Disp Refills   gabapentin (NEURONTIN) 600 MG tablet 90 tablet 2    Sig: Take 1 tablet (600 mg total) by mouth at bedtime. For restless legs.    Authorizing Provider: Doreene Nest   Refill(s) sent to pharmacy.

## 2022-08-19 ENCOUNTER — Ambulatory Visit
Admission: RE | Admit: 2022-08-19 | Discharge: 2022-08-19 | Disposition: A | Discharge status: Home / Self Care | Payer: Medicaid Other | Source: Ambulatory Visit | Attending: Primary Care | Admitting: Primary Care

## 2022-08-19 DIAGNOSIS — Z1231 Encounter for screening mammogram for malignant neoplasm of breast: Secondary | ICD-10-CM | POA: Diagnosis present

## 2023-01-16 NOTE — Progress Notes (Unsigned)
    Nicole Muzio T. Carollynn Pennywell, MD, CAQ Sports Medicine [facility] at [facility] [address] [place], [address]  Phone: [phone]  FAX: [phone]  Nicole Buchanan - 67 y.o. Nicole  MRN [id number]  Date of Birth: 12/14/1955  Date: 01/17/2023  PCP: Nicole Nest, NP  Referral: Nicole Nest, NP  No chief complaint on file.  Subjective:   Nicole Buchanan is a 67 y.o. very pleasant Nicole patient with There is no height or weight on file to calculate BMI. who presents with the following:  The patient presents with left-sided shoulder pain.  She has been having pain over the last 6 to 8 weeks.      Review of Systems is noted in the HPI, as appropriate  Objective:   There were no vitals taken for this visit.  GEN: No acute distress; alert,appropriate. PULM: Breathing comfortably in no respiratory distress PSYCH: Normally interactive.   Laboratory and Imaging Data:  Assessment and Plan:   ***

## 2023-01-17 ENCOUNTER — Ambulatory Visit: Payer: PPO | Admitting: Family Medicine

## 2023-01-17 ENCOUNTER — Ambulatory Visit (INDEPENDENT_AMBULATORY_CARE_PROVIDER_SITE_OTHER)
Admission: RE | Admit: 2023-01-17 | Discharge: 2023-01-17 | Disposition: A | Discharge status: Home / Self Care | Payer: PPO | Source: Ambulatory Visit | Attending: Family Medicine | Admitting: Family Medicine

## 2023-01-17 ENCOUNTER — Encounter: Payer: Self-pay | Admitting: Family Medicine

## 2023-01-17 VITALS — BP 130/84 | HR 70 | Temp 98.1°F | Ht 61.0 in | Wt 136.1 lb

## 2023-01-17 DIAGNOSIS — M25512 Pain in left shoulder: Secondary | ICD-10-CM

## 2023-01-17 DIAGNOSIS — M19012 Primary osteoarthritis, left shoulder: Secondary | ICD-10-CM | POA: Diagnosis not present

## 2023-01-17 DIAGNOSIS — M7582 Other shoulder lesions, left shoulder: Secondary | ICD-10-CM

## 2023-01-17 DIAGNOSIS — Z23 Encounter for immunization: Secondary | ICD-10-CM

## 2023-01-17 MED ORDER — TRIAMCINOLONE ACETONIDE 40 MG/ML IJ SUSP
40.0000 mg | Freq: Once | INTRAMUSCULAR | Status: AC
Start: 2023-01-17 — End: 2023-01-17
  Administered 2023-01-17: 40 mg via INTRA_ARTICULAR

## 2023-01-17 NOTE — Patient Instructions (Signed)
You should be able to take generic ibuprofen orally. (Over the counter Motrin, Advil, or Generic Ibuprofen 200 mg tablets. 3-4 tablets by mouth 3 times a day. This equals a prescription strength dose.)

## 2023-01-18 ENCOUNTER — Encounter: Payer: Self-pay | Admitting: Family Medicine

## 2023-02-09 ENCOUNTER — Other Ambulatory Visit: Payer: Self-pay | Admitting: Primary Care

## 2023-02-09 DIAGNOSIS — I10 Essential (primary) hypertension: Secondary | ICD-10-CM

## 2023-02-09 DIAGNOSIS — E785 Hyperlipidemia, unspecified: Secondary | ICD-10-CM

## 2023-02-11 ENCOUNTER — Other Ambulatory Visit: Payer: Self-pay | Admitting: Primary Care

## 2023-02-11 DIAGNOSIS — F419 Anxiety disorder, unspecified: Secondary | ICD-10-CM

## 2023-03-21 ENCOUNTER — Ambulatory Visit: Payer: PPO | Admitting: Family Medicine

## 2023-03-22 NOTE — Progress Notes (Unsigned)
   Nicole Yakel T. Alanis Clift, MD, CAQ Sports Medicine [facility] at [facility] [address] [place], [address]  Phone: [phone]  FAX: [phone]  Nicole Buchanan - 68 y.o. female  MRN [id number]  Date of Birth: [birth year]  Date: 03/23/2023  PCP: Nicole Nest, NP  Referral: Nicole Nest, NP  No chief complaint on file.  Subjective:   Nicole Buchanan is a 68 y.o. very pleasant female patient with There is no height or weight on file to calculate BMI. who presents with the following:  Patient is here to follow-up on some left-sided shoulder pain.  Last time I felt like she was having some rotator cuff tendinopathy in the end of November.  At that point, did have her start a throwers 12 type program and did a subacromial injection.    Review of Systems is noted in the HPI, as appropriate  Objective:   There were no vitals taken for this visit.  GEN: No acute distress; alert,appropriate. PULM: Breathing comfortably in no respiratory distress PSYCH: Normally interactive.   Laboratory and Imaging Data:  Assessment and Plan:   ***

## 2023-03-23 ENCOUNTER — Encounter: Payer: Self-pay | Admitting: Family Medicine

## 2023-03-23 ENCOUNTER — Ambulatory Visit (INDEPENDENT_AMBULATORY_CARE_PROVIDER_SITE_OTHER): Payer: PPO | Admitting: Family Medicine

## 2023-03-23 VITALS — BP 120/82 | HR 74 | Temp 98.3°F | Ht 61.0 in | Wt 136.5 lb

## 2023-03-23 DIAGNOSIS — M25512 Pain in left shoulder: Secondary | ICD-10-CM

## 2023-03-23 DIAGNOSIS — M7582 Other shoulder lesions, left shoulder: Secondary | ICD-10-CM | POA: Diagnosis not present

## 2023-05-04 ENCOUNTER — Other Ambulatory Visit: Payer: Self-pay | Admitting: Primary Care

## 2023-05-04 DIAGNOSIS — G2581 Restless legs syndrome: Secondary | ICD-10-CM

## 2023-05-04 NOTE — Telephone Encounter (Signed)
 Spoke to pt, scheduled cpe for 07/28/23

## 2023-05-04 NOTE — Telephone Encounter (Signed)
Patient is due for CPE/follow up in early June, this will be required prior to any further refills.  Please schedule, thank you!   

## 2023-05-12 ENCOUNTER — Other Ambulatory Visit: Payer: Self-pay | Admitting: Primary Care

## 2023-05-12 DIAGNOSIS — F419 Anxiety disorder, unspecified: Secondary | ICD-10-CM

## 2023-05-12 DIAGNOSIS — I10 Essential (primary) hypertension: Secondary | ICD-10-CM

## 2023-05-12 DIAGNOSIS — E785 Hyperlipidemia, unspecified: Secondary | ICD-10-CM

## 2023-05-25 ENCOUNTER — Encounter: Payer: Self-pay | Admitting: Family Medicine

## 2023-05-26 ENCOUNTER — Encounter: Payer: Self-pay | Admitting: Family Medicine

## 2023-05-26 ENCOUNTER — Ambulatory Visit (INDEPENDENT_AMBULATORY_CARE_PROVIDER_SITE_OTHER): Admitting: Family Medicine

## 2023-05-26 VITALS — BP 130/84 | HR 73 | Temp 98.2°F | Ht 61.0 in | Wt 135.5 lb

## 2023-05-26 DIAGNOSIS — M7542 Impingement syndrome of left shoulder: Secondary | ICD-10-CM | POA: Diagnosis not present

## 2023-05-26 NOTE — Progress Notes (Signed)
 Nicole Buchanan T. Nicole Markwood, MD, CAQ Sports Medicine [facility] at [facility] [address] [place], [address]  Phone: [phone]  FAX: [phone]  Nicole Buchanan - 68 y.o. female  MRN [id number]  Date of Birth: Mar 30, 1955  Date: 05/26/2023  PCP: Doreene Nest, NP  Referral: Doreene Nest, NP  Chief Complaint  Patient presents with   Shoulder Pain    Left    Subjective:   Nicole Buchanan is a 68 y.o. very pleasant female patient with Body mass index is 25.6 kg/m. who presents with the following:  She is a very pleasant patient, and she presents with some ongoing left-sided shoulder pain.  Does have deep dull ache in the shoulder and has some pain with terminal motion.  She denies pain around the clavicle, neck, AC joint, humerus, the rest of the upper extremity.  C she subjectively feels like she has had some weakness.  She has had some full motion without any kind of restriction.  She is essentially otherwise a healthy person, she does not have diabetes.  No injury, trauma, or major prior fracture or surgery.  L shoulder and pain with motion.  Will get sore and a deep ache in the shoulder  On plain x-rays from December, 2024, the patient has a preserved glenohumeral joint space.  Feels weak  Str normal ROM normal  There are no Patient Instructions on file for this visit.    Review of Systems is noted in the HPI, as appropriate  Objective:   BP 130/84 (BP Location: Left Arm, Patient Position: Sitting, Cuff Size: Normal)   Pulse 73   Temp 98.2 F (36.8 C) (Temporal)   Ht 5\' 1"  (1.549 m)   Wt 135 lb 8 oz (61.5 kg)   SpO2 98%   BMI 25.60 kg/m   GEN: No acute distress; alert,appropriate. PULM: Breathing comfortably in no respiratory distress PSYCH: Normally interactive.    Shoulder: L Inspection: No muscle wasting or winging Ecchymosis/edema: neg  AC joint, scapula, clavicle: NT Cervical spine: NT, full ROM Spurling's:  neg Abduction: full, 5/5 Flexion: full, 5/5 IR, full, lift-off: 5/5 ER at neutral: full, 5/5 AC crossover and compression: neg Neer: neg Hawkins: neg Drop Test: neg Jobe: neg Supraspinatus insertion: NT Bicipital groove: NT Speed's: neg Yergason's: neg Sulcus sign: neg Scapular dyskinesis: none C5-T1 intact Sensation intact Grip 5/5   Laboratory and Imaging Data:  Assessment and Plan:     ICD-10-CM   1. Impingement syndrome of left shoulder  M75.42      Ongoing shoulder pain with no significant glenohumeral arthritis on plain film.  I do think she is having some impingement, however exam is fairly benign, her strength is normal.  Do not suspect a more ominous injury.  Right now she would like to be conservative, think is entirely reasonable, she can do some home rehab and over-the-counter meds as needed for some pain relief.  Disposition: 6 to 8 weeks if not improving  [employer] One speech-to-text software was used for transcription in this dictation.  Possible transcriptional errors can occur using Animal nutritionist.   Signed,  Elpidio Galea. Lucero Ide, MD   Outpatient Encounter Medications as of 05/26/2023  Medication Sig   aspirin EC 81 MG tablet Take by mouth.   atorvastatin (LIPITOR) 40 MG tablet TAKE 1 TABLET BY MOUTH EVERY EVENING FORCHOLESTEROL   Calcium Carb-Cholecalciferol (CALCIUM 600 + D PO) Take by mouth.   gabapentin (NEURONTIN) 600 MG  tablet TAKE ONE TABLET BY MOUTH AT BEDTIME FOR RESTLESS LEGS   hydrochlorothiazide (HYDRODIURIL) 25 MG tablet TAKE 1 TABLET BY MOUTH EVERY DAY FOR BLOOD PRESSURE   lisinopril (ZESTRIL) 5 MG tablet TAKE 1 TABLET BY MOUTH EVERY DAY FOR BLOOD PRESSURE   omeprazole (PRILOSEC) 20 MG capsule Take 20 mg by mouth daily.   venlafaxine XR (EFFEXOR-XR) 150 MG 24 hr capsule TAKE 1 CAPSULE BY MOUTH ONCE DAILY WITH BREAKFAST FOR ANXIETY AND DEPRESSION   No facility-administered encounter medications on file as of 05/26/2023.

## 2023-05-28 ENCOUNTER — Encounter: Payer: Self-pay | Admitting: Family Medicine

## 2023-06-02 ENCOUNTER — Other Ambulatory Visit: Payer: Self-pay | Admitting: Primary Care

## 2023-06-02 DIAGNOSIS — G2581 Restless legs syndrome: Secondary | ICD-10-CM

## 2023-07-21 ENCOUNTER — Ambulatory Visit: Admitting: Primary Care

## 2023-07-21 ENCOUNTER — Ambulatory Visit: Payer: Self-pay | Admitting: Primary Care

## 2023-07-21 ENCOUNTER — Encounter: Payer: Self-pay | Admitting: Primary Care

## 2023-07-21 VITALS — BP 136/68 | HR 64 | Temp 97.2°F | Ht 61.0 in | Wt 137.0 lb

## 2023-07-21 DIAGNOSIS — I10 Essential (primary) hypertension: Secondary | ICD-10-CM

## 2023-07-21 DIAGNOSIS — E2839 Other primary ovarian failure: Secondary | ICD-10-CM

## 2023-07-21 DIAGNOSIS — F32A Depression, unspecified: Secondary | ICD-10-CM

## 2023-07-21 DIAGNOSIS — F419 Anxiety disorder, unspecified: Secondary | ICD-10-CM | POA: Diagnosis not present

## 2023-07-21 DIAGNOSIS — E785 Hyperlipidemia, unspecified: Secondary | ICD-10-CM

## 2023-07-21 DIAGNOSIS — Z23 Encounter for immunization: Secondary | ICD-10-CM | POA: Diagnosis not present

## 2023-07-21 DIAGNOSIS — K219 Gastro-esophageal reflux disease without esophagitis: Secondary | ICD-10-CM | POA: Diagnosis not present

## 2023-07-21 DIAGNOSIS — R7303 Prediabetes: Secondary | ICD-10-CM | POA: Diagnosis not present

## 2023-07-21 DIAGNOSIS — Z1231 Encounter for screening mammogram for malignant neoplasm of breast: Secondary | ICD-10-CM

## 2023-07-21 DIAGNOSIS — G2581 Restless legs syndrome: Secondary | ICD-10-CM | POA: Diagnosis not present

## 2023-07-21 DIAGNOSIS — Z0000 Encounter for general adult medical examination without abnormal findings: Secondary | ICD-10-CM

## 2023-07-21 LAB — COMPREHENSIVE METABOLIC PANEL WITH GFR
ALT: 15 U/L (ref 0–35)
AST: 15 U/L (ref 0–37)
Albumin: 4.2 g/dL (ref 3.5–5.2)
Alkaline Phosphatase: 75 U/L (ref 39–117)
BUN: 20 mg/dL (ref 6–23)
CO2: 30 meq/L (ref 19–32)
Calcium: 9.9 mg/dL (ref 8.4–10.5)
Chloride: 106 meq/L (ref 96–112)
Creatinine, Ser: 0.75 mg/dL (ref 0.40–1.20)
GFR: 83.36 mL/min (ref >60.00)
Glucose, Bld: 88 mg/dL (ref 70–99)
Potassium: 3.8 meq/L (ref 3.5–5.1)
Sodium: 142 meq/L (ref 135–145)
Total Bilirubin: 0.4 mg/dL (ref 0.2–1.2)
Total Protein: 5.9 g/dL — ABNORMAL LOW (ref 6.0–8.3)

## 2023-07-21 LAB — HEMOGLOBIN A1C: Hgb A1c MFr Bld: 5.5 % (ref 4.6–6.5)

## 2023-07-21 LAB — LIPID PANEL
Cholesterol: 153 mg/dL (ref 0–200)
HDL: 62.4 mg/dL (ref >39.00)
LDL Cholesterol: 74 mg/dL (ref 0–99)
NonHDL: 90.66
Total CHOL/HDL Ratio: 2
Triglycerides: 82 mg/dL (ref 0.0–149.0)
VLDL: 16.4 mg/dL (ref 0.0–40.0)

## 2023-07-21 LAB — CBC
HCT: 41.2 % (ref 36.0–46.0)
Hemoglobin: 14.1 g/dL (ref 12.0–15.0)
MCHC: 34.2 g/dL (ref 30.0–36.0)
MCV: 93.1 fl (ref 78.0–100.0)
Platelets: 218 10*3/uL (ref 150.0–400.0)
RBC: 4.43 Mil/uL (ref 3.87–5.11)
RDW: 12.6 % (ref 11.5–15.5)
WBC: 4.3 10*3/uL (ref 4.0–10.5)

## 2023-07-21 MED ORDER — ESCITALOPRAM OXALATE 10 MG PO TABS: ORAL_TABLET | ORAL | 0 refills | Status: DC

## 2023-07-21 MED ORDER — VENLAFAXINE HCL ER 37.5 MG PO CP24: ORAL_CAPSULE | ORAL | 0 refills | Status: DC

## 2023-07-21 MED ORDER — OMEPRAZOLE 20 MG PO CPDR
20.0000 mg | DELAYED_RELEASE_CAPSULE | Freq: Every day | ORAL | 3 refills | Status: AC
Start: 2023-07-21 — End: ?

## 2023-07-21 NOTE — Patient Instructions (Addendum)
 We are weaning you off of venlafaxine  and transitioning over to Lexapro for anxiety/depression.  Venlafaxine  ER 37.5 mg: Take 2 capsules by mouth once daily for 2 weeks, then reduce to 1 capsule once daily for 2 weeks, then stop for anxiety/depression  Lexapro 10 mg: Take 1/2 tablet by mouth once daily for 2 weeks, then increase to 1 full tablet daily thereafter.  Stop by the lab prior to leaving today. I will notify you of your results once received.   Call the [facility] to schedule your mammogram and bone density scan.   Schedule a follow-up visit in 1 month for anxiety/depression.  This can be a virtual visit if you prefer.  It was a pleasure to see you today!

## 2023-07-21 NOTE — Progress Notes (Addendum)
 Subjective:    Nicole Buchanan is a 68 y.o. female who presents for a Welcome to Medicare exam.   Review of Systems  Constitutional:  Negative for chills and fever.  HENT:  Negative for sore throat.   Eyes:  Negative for blurred vision.  Respiratory:  Negative for shortness of breath.   Cardiovascular:  Negative for chest pain.  Gastrointestinal:  Negative for constipation and diarrhea.  Genitourinary:  Negative for dysuria.  Musculoskeletal:  Positive for joint pain.  Skin:  Negative for rash.  Neurological:  Negative for dizziness and headaches.  Endo/Heme/Allergies:  Negative for environmental allergies.  Psychiatric/Behavioral:  Positive for depression. The patient is nervous/anxious.        See HPI           Immunizations: -Tetanus: Completed in 2018  -Shingles: Completed Shingrix  series -Pneumonia: Never completed, due today   Mammogram: Completed in July 2024  Colonoscopy: Completed in 2020, due 2030  Dexa: Due  She would also like to discuss anxiety/depression. Currently managed on venlafaxine  ER 150 mg once daily for which she has been taking since around 2018. She doesn't feel that Effexor  is effective any longer. Over the last 6-12 months she's felt this way. Symptoms include feeling down, little motivation to do things, feeling anxious, worrying constantly. She was once managed on Paxil years ago, stopped taking as she felt better.   BP Readings from Last 3 Encounters:  07/21/23 136/68  05/26/23 130/84  03/23/23 120/82       Objective:     Today's Vitals   07/21/23 0801  BP: 136/68  Pulse: 64  Temp: (!) 97.2 F (36.2 C)  TempSrc: Temporal  SpO2: 95%  Weight: 137 lb (62.1 kg)  Height: 5\' 1"  (1.549 m)  Body mass index is 25.89 kg/m.  Medications Outpatient Encounter Medications as of 07/21/2023  Medication Sig   aspirin EC 81 MG tablet Take by mouth.   atorvastatin  (LIPITOR) 40 MG tablet TAKE 1 TABLET BY MOUTH EVERY EVENING FORCHOLESTEROL    Calcium  Carb-Cholecalciferol (CALCIUM  600 + D PO) Take by mouth.   escitalopram  (LEXAPRO ) 10 MG tablet Take 1/2 tablet by mouth once daily for 2 weeks, then increase to 1 full tablet thereafter for anxiety and depression.   gabapentin  (NEURONTIN ) 600 MG tablet TAKE ONE TABLET BY MOUTH AT BEDTIME FOR RESTLESS LEGS   hydrochlorothiazide  (HYDRODIURIL ) 25 MG tablet TAKE 1 TABLET BY MOUTH EVERY DAY FOR BLOOD PRESSURE   lisinopril  (ZESTRIL ) 5 MG tablet TAKE 1 TABLET BY MOUTH EVERY DAY FOR BLOOD PRESSURE   venlafaxine  XR (EFFEXOR -XR) 37.5 MG 24 hr capsule Take 2 capsules by mouth once daily for 2 weeks, then reduce to 1 capsule once daily for 2 weeks, then stop for anxiety/depression   [DISCONTINUED] omeprazole  (PRILOSEC) 20 MG capsule Take 20 mg by mouth daily.   [DISCONTINUED] venlafaxine  XR (EFFEXOR -XR) 150 MG 24 hr capsule TAKE 1 CAPSULE BY MOUTH ONCE DAILY WITH BREAKFAST FOR ANXIETY AND DEPRESSION   omeprazole  (PRILOSEC) 20 MG capsule Take 1 capsule (20 mg total) by mouth daily. for heartburn.   No facility-administered encounter medications on file as of 07/21/2023.     History: Past Medical History:  Diagnosis Date   Depression    GERD (gastroesophageal reflux disease)    Herpes zoster 01/21/2021   Hiatal hernia    Hyperlipemia    Hypertension    Past Surgical History:  Procedure Laterality Date   ABDOMINAL HYSTERECTOMY     CESAREAN SECTION  COLONOSCOPY WITH PROPOFOL  N/A 05/03/2018   Procedure: COLONOSCOPY WITH PROPOFOL ;  Surgeon: Irby Mannan, MD;  Location: [facility];  Service: Endoscopy;  Laterality: N/A;   DORSAL COMPARTMENT RELEASE  06/29/2011   Procedure: RELEASE DORSAL COMPARTMENT (DEQUERVAIN);  Surgeon: Kemp Patter, MD;  Location: [facility];  Service: Orthopedics;  Laterality: Right;   TONSILLECTOMY      Family History  Problem Relation Age of Onset   Breast cancer Cousin        maternal side -49's   COPD Father    Social History    Occupational History   Not on file  Tobacco Use   Smoking status: Former    Current packs/day: 0.00    Types: Cigarettes    Quit date: 06/27/2001    Years since quitting: 22.0   Smokeless tobacco: Never  Vaping Use   Vaping status: Never Used  Substance and Sexual Activity   Alcohol use: Yes   Drug use: No   Sexual activity: Not on file    Tobacco Counseling Counseling given: Not Answered   Immunizations and Health Maintenance Immunization History  Administered Date(s) Administered   Fluad Trivalent(High Dose 65+) 01/17/2023   Influenza,inj,Quad PF,6+ Mos 11/05/2013, 02/20/2015, 01/08/2016, 01/12/2017, 04/11/2018   Influenza-Unspecified 11/05/2013, 02/20/2015, 01/08/2016, 12/14/2018   PFIZER(Purple Top)SARS-COV-2 Vaccination 05/01/2019, 05/29/2019, 12/15/2019   Td 01/12/2017   Tdap 06/05/2010   Zoster Recombinant(Shingrix ) 04/16/2019, 07/02/2020   Health Maintenance Due  Topic Date Due   Pneumonia Vaccine 47+ Years old (1 of 1 - PCV) Never done   DEXA SCAN  Never done    Activities of Daily Living    07/21/2023    7:56 AM  In your present state of health, do you have any difficulty performing the following activities:  Hearing? 0  Vision? 0  Difficulty concentrating or making decisions? 0  Walking or climbing stairs? 0  Dressing or bathing? 0  Doing errands, shopping? 0    Physical Exam Cardiovascular:     Rate and Rhythm: Normal rate and regular rhythm.  Pulmonary:     Effort: Pulmonary effort is normal.     Breath sounds: Normal breath sounds.  Abdominal:     General: Bowel sounds are normal.     Palpations: Abdomen is soft.     Tenderness: There is no abdominal tenderness.  Musculoskeletal:     Cervical back: Neck supple.  Skin:    General: Skin is warm and dry.  Neurological:     Mental Status: She is alert and oriented to person, place, and time.  Psychiatric:        Mood and Affect: Mood normal.     Advanced Directives: Discussed.         ECG: NSR with rate of 65, no PAC/PVC, no acute ST changes. Appears similar to ECG from 2017 Assessment:     This is a routine wellness examination for this patient .   Vision/Hearing screen Hearing Screening   500Hz  1000Hz  2000Hz  4000Hz   Right ear 20 Fail 20 20  Left ear 20 20 20 20    Vision Screening   Right eye Left eye Both eyes  Without correction     With correction 20/15 20/20 20/15     Dietary issues and exercise activities discussed:      Goals   None    Depression Screen    07/21/2023    7:55 AM 07/20/2022    2:05 PM 07/06/2022   12:32 PM 07/01/2021  9:53 AM  PHQ 2/9 Scores  PHQ - 2 Score 4 0 0 0  PHQ- 9 Score 11        Fall Risk    07/21/2023    7:55 AM  Fall Risk   Falls in the past year? 0  Number falls in past yr: 0  Injury with Fall? 0  Risk for fall due to : No Fall Risks  Follow up Falls evaluation completed    Cognitive Function:        07/21/2023    7:57 AM  6CIT Screen  What Year? 0 points  What month? 0 points  What time? 0 points  Count back from 20 0 points  Months in reverse 0 points  Repeat phrase 0 points  Total Score 0 points    Patient Care Team: Gabriel John, NP as PCP - General (Internal Medicine)     Plan:   See problem based charting.   I have personally reviewed and noted the following in the patient's chart:   Medical and social history Use of alcohol, tobacco or illicit drugs  Current medications and supplements Functional ability and status Nutritional status Physical activity Advanced directives List of other physicians Hospitalizations, surgeries, and ER visits in previous 12 months Vitals Screenings to include cognitive, depression, and falls Referrals and appointments  In addition, I have reviewed and discussed with patient certain preventive protocols, quality metrics, and best practice recommendations. A written personalized care plan for preventive services as well as general  preventive health recommendations were provided to patient.     Tinna Kolker K Kijana Cromie, NP 07/21/2023

## 2023-07-21 NOTE — Assessment & Plan Note (Signed)
 Prevnar 20 provided today.  Mammogram and bone density scan due in July, orders placed. Colonoscopy UTD, due 2030  Discussed the importance of a healthy diet and regular exercise in order for weight loss, and to reduce the risk of further co-morbidity.  Exam stable. Labs pending.  Follow up in 1 year for repeat physical.

## 2023-07-21 NOTE — Assessment & Plan Note (Signed)
 Repeat A1c pending

## 2023-07-21 NOTE — Assessment & Plan Note (Signed)
 Repeat lipid panel pending. Continue atorvastatin 40 mg daily.

## 2023-07-21 NOTE — Assessment & Plan Note (Signed)
Controlled.   Continue omeprazole 20 mg daily. 

## 2023-07-21 NOTE — Assessment & Plan Note (Signed)
Controlled.  Continue gabapentin 600 mg at bedtime.

## 2023-07-21 NOTE — Assessment & Plan Note (Signed)
 Deteriorated.  Discussed options for treatment. As she is nearly at max dose of venlafaxine , I'm not confident that she will feel much of an improvement with a dose increase. She agrees.  Slowly wean off venlafaxine  ER 150 mg. Instructions provided. Start Lexapro 10 mg daily, instructions provided.   Follow up in 1 month.

## 2023-07-21 NOTE — Assessment & Plan Note (Signed)
 Controlled.  Continue hydrochlorothiazide  25 mg daily and lisinopril  5 mg daily.  CMP pending.

## 2023-07-28 ENCOUNTER — Encounter: Admitting: Primary Care

## 2023-08-16 ENCOUNTER — Other Ambulatory Visit: Payer: Self-pay | Admitting: Primary Care

## 2023-08-16 DIAGNOSIS — G2581 Restless legs syndrome: Secondary | ICD-10-CM

## 2023-08-16 DIAGNOSIS — E785 Hyperlipidemia, unspecified: Secondary | ICD-10-CM

## 2023-08-16 DIAGNOSIS — I10 Essential (primary) hypertension: Secondary | ICD-10-CM

## 2023-08-23 ENCOUNTER — Telehealth (INDEPENDENT_AMBULATORY_CARE_PROVIDER_SITE_OTHER): Admitting: Primary Care

## 2023-08-23 ENCOUNTER — Encounter: Payer: Self-pay | Admitting: Primary Care

## 2023-08-23 VITALS — Ht 61.0 in | Wt 137.0 lb

## 2023-08-23 DIAGNOSIS — F32A Depression, unspecified: Secondary | ICD-10-CM

## 2023-08-23 DIAGNOSIS — F419 Anxiety disorder, unspecified: Secondary | ICD-10-CM

## 2023-08-23 NOTE — Patient Instructions (Signed)
 Continue taking Lexapro  20 mg daily for anxiety/depression.  It was a pleasure to see you today!

## 2023-08-23 NOTE — Assessment & Plan Note (Signed)
 Improved!  Continue Lexapro  10 mg daily. Follow-up as needed

## 2023-08-23 NOTE — Progress Notes (Signed)
 Patient ID: Nicole Buchanan, female    DOB: 01-Nov-1955, 68 y.o.   MRN: [id number]  Virtual visit completed through [employer], a video enabled telemedicine application. Due to national recommendations of social distancing due to COVID-19, a virtual visit is felt to be most appropriate for this patient at this time. Reviewed limitations, risks, security and privacy concerns of performing a virtual visit and the availability of in person appointments. I also reviewed that there may be a patient responsible charge related to this service. The patient agreed to proceed.   Patient location: home Provider location: [place] [facility], office Persons participating in this virtual visit: patient, provider   If any vitals were documented, they were collected by patient at home unless specified below.    Ht 5' 1 (1.549 m) Comment: per patient  Wt 137 lb (62.1 kg) Comment: per patient  BMI 25.89 kg/m    CC: Anxiety/Depression Subjective:   HPI: Nicole Buchanan is a 68 y.o. female with a history of anxiety and depression presenting on 08/23/2023 for Medical Management of Chronic Issues  She was last evaluated on 07/21/2023 for her welcome to Medicare visit which she mentioned ongoing anxiety and depression despite management on venlafaxine  150 mg daily.  She had been managed on venlafaxine  since 2018, felt it was no longer effective so we decided to wean her off venlafaxine  and initiate Lexapro  10 mg daily.  She is here for follow-up today.  Since her last visit she has successfully weaned off venlafaxine  ER 150 mg and has been taking Lexapro  10 mg daily. She is feeling better. She is feeling like a fog has lifted. Other positive effects include more motivation to do things, able to complete tasks, improved worry, feeling less anxious. 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 She is very pleased with the effects from Lexapro . She denies side effects from Lexapro .       Relevant  past medical, surgical, family and social history reviewed and updated as indicated. Interim medical history since our last visit reviewed. Allergies and medications reviewed and updated. Outpatient Medications Prior to Visit  Medication Sig Dispense Refill   aspirin EC 81 MG tablet Take by mouth.     atorvastatin  (LIPITOR) 40 MG tablet TAKE 1 TABLET BY MOUTH EVERY EVENING FORCHOLESTEROL 90 tablet 2   Calcium  Carb-Cholecalciferol (CALCIUM  600 + D PO) Take by mouth.     escitalopram  (LEXAPRO ) 10 MG tablet Take 1/2 tablet by mouth once daily for 2 weeks, then increase to 1 full tablet thereafter for anxiety and depression. 90 tablet 0   gabapentin  (NEURONTIN ) 600 MG tablet TAKE ONE TABLET BY MOUTH AT BEDTIME FOR RESTLESS LEGS 90 tablet 2   hydrochlorothiazide  (HYDRODIURIL ) 25 MG tablet TAKE 1 TABLET BY MOUTH EVERY DAY FOR BLOOD PRESSURE 90 tablet 2   lisinopril  (ZESTRIL ) 5 MG tablet TAKE 1 TABLET BY MOUTH EVERY DAY FOR BLOOD PRESSURE 90 tablet 2   omeprazole  (PRILOSEC) 20 MG capsule Take 1 capsule (20 mg total) by mouth daily. for heartburn. 90 capsule 3   venlafaxine  XR (EFFEXOR -XR) 37.5 MG 24 hr capsule Take 2 capsules by mouth once daily for 2 weeks, then reduce to 1 capsule once daily for 2 weeks, then stop for anxiety/depression (Patient not taking: Reported on 08/23/2023) 42 capsule 0   No facility-administered medications prior to visit.     Per HPI unless specifically indicated in ROS section below Review of Systems  Respiratory:  Negative  for shortness of breath.   Cardiovascular:  Negative for chest pain.  Psychiatric/Behavioral:  The patient is nervous/anxious.        See HPI   Objective:  Ht 5' 1 (1.549 m) Comment: per patient  Wt 137 lb (62.1 kg) Comment: per patient  BMI 25.89 kg/m   Wt Readings from Last 3 Encounters:  08/23/23 137 lb (62.1 kg)  07/21/23 137 lb (62.1 kg)  05/26/23 135 lb 8 oz (61.5 kg)       Physical exam: General: Alert and oriented x 3, no distress,  does not appear sickly  Pulmonary: Speaks in complete sentences without increased work of breathing, no cough during visit.  Psychiatric: Normal mood, thought content, and behavior. Smiling.      Results for orders placed or performed in visit on 07/21/23  Lipid panel   Collection Time: 07/21/23  8:33 AM  Result Value Ref Range   Cholesterol 153 0 - 200 mg/dL   Triglycerides 17.9 0.0 - 149.0 mg/dL   HDL 37.59 >60.99 mg/dL   VLDL 83.5 0.0 - 59.9 mg/dL   LDL Cholesterol 74 0 - 99 mg/dL   Total CHOL/HDL Ratio 2    NonHDL 90.66   Hemoglobin A1c   Collection Time: 07/21/23  8:33 AM  Result Value Ref Range   Hgb A1c MFr Bld 5.5 4.6 - 6.5 %  Comprehensive metabolic panel with GFR   Collection Time: 07/21/23  8:33 AM  Result Value Ref Range   Sodium 142 135 - 145 mEq/L   Potassium 3.8 3.5 - 5.1 mEq/L   Chloride 106 96 - 112 mEq/L   CO2 30 19 - 32 mEq/L   Glucose, Bld 88 70 - 99 mg/dL   BUN 20 6 - 23 mg/dL   Creatinine, Ser 9.24 0.40 - 1.20 mg/dL   Total Bilirubin 0.4 0.2 - 1.2 mg/dL   Alkaline Phosphatase 75 39 - 117 U/L   AST 15 0 - 37 U/L   ALT 15 0 - 35 U/L   Total Protein 5.9 (L) 6.0 - 8.3 g/dL   Albumin 4.2 3.5 - 5.2 g/dL   GFR 16.63 >39.99 mL/min   Calcium  9.9 8.4 - 10.5 mg/dL  CBC   Collection Time: 07/21/23  8:33 AM  Result Value Ref Range   WBC 4.3 4.0 - 10.5 K/uL   RBC 4.43 3.87 - 5.11 Mil/uL   Platelets 218.0 150.0 - 400.0 K/uL   Hemoglobin 14.1 12.0 - 15.0 g/dL   HCT 58.7 63.9 - 53.9 %   MCV 93.1 78.0 - 100.0 fl   MCHC 34.2 30.0 - 36.0 g/dL   RDW 87.3 88.4 - 84.4 %   Assessment & Plan:   Problem List Items Addressed This Visit       Other   Anxiety and depression - Primary   Improved!  Continue Lexapro  10 mg daily. Follow-up as needed        No orders of the defined types were placed in this encounter.  No orders of the defined types were placed in this encounter.   I discussed the assessment and treatment plan with the patient. The patient  was provided an opportunity to ask questions and all were answered. The patient agreed with the plan and demonstrated an understanding of the instructions. The patient was advised to call back or seek an in-person evaluation if the symptoms worsen or if the condition fails to improve as anticipated.  Follow up plan:  Continue taking Lexapro  20 mg  daily for anxiety/depression.  It was a pleasure to see you today!   Paschal Blanton K Jenny Lai, NP

## 2023-09-03 ENCOUNTER — Telehealth: Admitting: Family Medicine

## 2023-09-03 ENCOUNTER — Encounter: Payer: Self-pay | Admitting: Nurse Practitioner

## 2023-09-03 ENCOUNTER — Encounter: Admitting: Nurse Practitioner

## 2023-09-03 ENCOUNTER — Other Ambulatory Visit: Payer: Self-pay | Admitting: Nurse Practitioner

## 2023-09-03 DIAGNOSIS — L237 Allergic contact dermatitis due to plants, except food: Secondary | ICD-10-CM

## 2023-09-03 MED ORDER — PREDNISONE 20 MG PO TABS: 20.0000 mg | ORAL_TABLET | Freq: Two times a day (BID) | ORAL | 0 refills | Status: DC

## 2023-09-03 MED ORDER — PREDNISONE 20 MG PO TABS: 20.0000 mg | ORAL_TABLET | Freq: Two times a day (BID) | ORAL | 0 refills | Status: AC

## 2023-09-03 NOTE — Patient Instructions (Signed)
Poison Oak Dermatitis  Poison oak dermatitis is inflammation of the skin that is caused by contact with the chemicals in the leaves of the poison oak (Toxicodendron) plant. The skin reaction often includes redness, swelling, blisters, and extreme itching. What are the causes? This condition is caused by a specific chemical (urushiol) that is found in the sap of the poison oak plant. This chemical is sticky and can be easily spread to people, animals, and objects. You can get poison oak dermatitis by: Having direct contact with a poison oak plant. Touching animals, other people, or objects that have come in contact with poison oak and have the chemical on them. What increases the risk? This condition is more likely to develop in people who: Are outdoors often in wooded or [place] areas. Go outdoors without wearing protective clothing, such as closed shoes, long pants, and a long-sleeved shirt. What are the signs or symptoms? Symptoms of this condition include: Redness of the skin. Extreme itching. A rash that often includes bumps and blisters. The rash usually appears 48 hours after exposure if you have been exposed before. If this is the first time you have been exposed, the rash may not appear until a week after exposure. Swelling. This may occur if the reaction is more severe. Symptoms usually last for 1-2 weeks. However, the first time you develop this condition, symptoms may last 3-4 weeks. How is this diagnosed? This condition may be diagnosed based on your symptoms and a physical exam. Your health care provider may also ask you about any recent outdoor activity. How is this treated? Treatment for this condition will vary depending on how severe it is. Treatment may include: Hydrocortisone creams or calamine lotions to relieve itching. Oatmeal baths to soothe the skin. Over-the-counter antihistamine medicines to help reduce itching. Steroid medicine taken by mouth (orally) for more  severe reactions. Follow these instructions at home: Medicines Take or apply over-the-counter and prescription medicines only as told by your health care provider. Use hydrocortisone creams or calamine lotion as needed to soothe the skin and relieve itching. General instructions Do not scratch or rub your skin. Apply a cold, wet cloth (cold compress) to the affected areas or take baths in cool water. This will help with itching. Avoid hot baths and showers. Take oatmeal baths as needed. Use colloidal oatmeal. You can get this at your local pharmacy or grocery store. Follow the instructions on the packaging. Wash clothes, bedsheets, towels, and blankets that you wore or came in contact with between your exposure to the plant and the appearance of your rash. The oils can remain on these items and continue to cause new exposure. Check the affected area every day for signs of infection. Check for: More redness, swelling, or pain. Fluid or blood. Warmth. Pus or a bad smell. Keep all follow-up visits. Your health care provider may want to see how your skin is progressing with treatment. How is this prevented?  Learn to identify the poison oak plant and avoid contact with the plant. This plant can be recognized by the number of leaves. Generally, poison oak has three leaves with flowering branches on a single stem. The leaves are often a bit fuzzy and have a toothlike edge. If you have been exposed to poison oak, thoroughly wash your skin with soap and water right away. You have about 30 minutes to remove the plant resin before it will cause the rash. Be sure to wash under your fingernails because any plant resin there  will continue to spread the rash. When hiking or camping, wear clothes that will help you avoid exposure on the skin. This includes long pants, a long-sleeved shirt, long socks, and hiking boots. You can also apply preventive lotion to your skin to help limit exposure. If you suspect  that your clothes or outdoor gear came in contact with poison oak, rinse them off outside with a garden hose before bringing them inside your house. When doing yard work or gardening, wear gloves, long sleeves, long pants, and boots. Wash your garden tools and gloves if they come in contact with poison oak. If you suspect that your pet has come into contact with poison oak, wash them with pet shampoo and water. Make sure you wear gloves while washing your pet. Do not burn poison oak plants. This can release the chemical from the plant into the air and may cause a reaction on the skin or eyes, or in the lungs from breathing in the smoke. Contact a health care provider if: You have open sores in the rash area. You have any signs of infection. You have redness that spreads beyond the rash area. You have a fever. You have a rash over a large area of your body. You have a rash on your eyes, mouth, or genitals. You have a rash that does not improve after a few weeks. Get help right away if: Your face swells or your eyes swell shut. You have trouble breathing. You have trouble swallowing. These symptoms may be an emergency. Get help right away. Call 911. Do not wait to see if the symptoms will go away. Do not drive yourself to the hospital. This information is not intended to replace advice given to you by your health care provider. Make sure you discuss any questions you have with your health care provider. Document Revised: 08/19/2021 Document Reviewed: 07/09/2021 Elsevier Patient Education  2024 [employer].

## 2023-09-03 NOTE — Progress Notes (Signed)
 Mrs. Dehne is requesting to have her prednisone  sent to [facility] instead of [facility]

## 2023-09-03 NOTE — Progress Notes (Signed)
 Virtual Visit Consent   [name], you are scheduled for a virtual visit with a [facility] provider today. Just as with appointments in the office, your consent must be obtained to participate. Your consent will be active for this visit and any virtual visit you may have with one of our providers in the next 365 days. If you have a MyChart account, a copy of this consent can be sent to you electronically.  As this is a virtual visit, video technology does not allow for your provider to perform a traditional examination. This may limit your provider's ability to fully assess your condition. If your provider identifies any concerns that need to be evaluated in person or the need to arrange testing (such as labs, EKG, etc.), we will make arrangements to do so. Although advances in technology are sophisticated, we cannot ensure that it will always work on either your end or our end. If the connection with a video visit is poor, the visit may have to be switched to a telephone visit. With either a video or telephone visit, we are not always able to ensure that we have a secure connection.  By engaging in this virtual visit, you consent to the provision of healthcare and authorize for your insurance to be billed (if applicable) for the services provided during this visit. Depending on your insurance coverage, you may receive a charge related to this service.  I need to obtain your verbal consent now. Are you willing to proceed with your visit today? Nicole Buchanan has provided verbal consent on 09/03/2023 for a virtual visit (video or telephone). Loa Lamp, FNP  Date: 09/03/2023 3:42 PM   Virtual Visit via Video Note   I, Loa Lamp, connected with  Nicole Buchanan  ([id number], 03-03-1955) on 09/03/23 at  3:45 PM EDT by a video-enabled telemedicine application and verified that I am speaking with the correct person using two identifiers.  Location: Patient: Virtual Visit Location Patient:  Home Provider: Virtual Visit Location Provider: Home Office   I discussed the limitations of evaluation and management by telemedicine and the availability of in person appointments. The patient expressed understanding and agreed to proceed.    History of Present Illness: Nicole Buchanan is a 69 y.o. who identifies as a female who was assigned female at birth, and is being seen today for poison oak on neck, lip and hands. She pulled weeds yesterday. Had the same yesterday. Prednisone  has worked well in the past.   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   Prediabetes 01/12/2017   Preventative health care 01/12/2017   Restless leg syndrome 07/23/2016   Anxiety and depression 07/23/2016   Essential hypertension 07/23/2016   Hyperlipidemia 07/23/2016   GERD (gastroesophageal reflux disease) 07/23/2016   Palpitations 07/23/2016    Allergies:  Allergies  Allergen Reactions   Codeine Nausea And Vomiting   Penicillins     dizzy   Medications:  Current Outpatient Medications:    aspirin EC 81 MG tablet, Take by mouth., Disp: , Rfl:    atorvastatin  (LIPITOR) 40 MG tablet, TAKE 1 TABLET BY MOUTH EVERY EVENING FORCHOLESTEROL, Disp: 90 tablet, Rfl: 2   Calcium  Carb-Cholecalciferol (CALCIUM  600 + D PO), Take by mouth., Disp: , Rfl:    escitalopram  (LEXAPRO ) 10 MG tablet, Take 1/2 tablet by mouth once daily for 2 weeks, then increase  to 1 full tablet thereafter for anxiety and depression., Disp: 90 tablet, Rfl: 0   gabapentin  (NEURONTIN ) 600 MG tablet, TAKE ONE TABLET BY MOUTH AT BEDTIME FOR RESTLESS LEGS, Disp: 90 tablet, Rfl: 2   hydrochlorothiazide  (HYDRODIURIL ) 25 MG tablet, TAKE 1 TABLET BY MOUTH EVERY DAY FOR BLOOD PRESSURE, Disp: 90 tablet, Rfl: 2   lisinopril  (ZESTRIL ) 5 MG tablet, TAKE 1 TABLET BY MOUTH EVERY DAY FOR BLOOD PRESSURE, Disp: 90 tablet, Rfl: 2   omeprazole   (PRILOSEC) 20 MG capsule, Take 1 capsule (20 mg total) by mouth daily. for heartburn., Disp: 90 capsule, Rfl: 3   venlafaxine  XR (EFFEXOR -XR) 37.5 MG 24 hr capsule, Take 2 capsules by mouth once daily for 2 weeks, then reduce to 1 capsule once daily for 2 weeks, then stop for anxiety/depression (Patient not taking: Reported on 08/23/2023), Disp: 42 capsule, Rfl: 0  Observations/Objective: Patient is well-developed, well-nourished in no acute distress.  Resting comfortably  at home.  Head is normocephalic, atraumatic.  No labored breathing.  Speech is clear and coherent with logical content.  Patient is alert and oriented at baseline.    Assessment and Plan: 1. Allergic contact dermatitis due to plants, except food (Primary)  May use prednisone  and benadryl, uc as needed.   Follow Up Instructions: I discussed the assessment and treatment plan with the patient. The patient was provided an opportunity to ask questions and all were answered. The patient agreed with the plan and demonstrated an understanding of the instructions.  A copy of instructions were sent to the patient via MyChart unless otherwise noted below.     The patient was advised to call back or seek an in-person evaluation if the symptoms worsen or if the condition fails to improve as anticipated.    Daxson Reffett, FNP

## 2023-09-03 NOTE — Progress Notes (Signed)
 Duplicate visit. Pls disregard

## 2023-10-04 ENCOUNTER — Ambulatory Visit
Admission: RE | Admit: 2023-10-04 | Discharge: 2023-10-04 | Disposition: A | Discharge status: Home / Self Care | Source: Ambulatory Visit | Attending: Primary Care | Admitting: Primary Care

## 2023-10-04 ENCOUNTER — Ambulatory Visit
Admission: RE | Admit: 2023-10-04 | Discharge: 2023-10-04 | Disposition: A | Discharge status: Home / Self Care | Source: Ambulatory Visit | Attending: Primary Care

## 2023-10-04 DIAGNOSIS — Z1231 Encounter for screening mammogram for malignant neoplasm of breast: Secondary | ICD-10-CM | POA: Insufficient documentation

## 2023-10-04 DIAGNOSIS — M8589 Other specified disorders of bone density and structure, multiple sites: Secondary | ICD-10-CM | POA: Diagnosis not present

## 2023-10-04 DIAGNOSIS — E2839 Other primary ovarian failure: Secondary | ICD-10-CM

## 2023-10-04 DIAGNOSIS — Z780 Asymptomatic menopausal state: Secondary | ICD-10-CM | POA: Diagnosis not present

## 2023-10-24 ENCOUNTER — Other Ambulatory Visit: Payer: Self-pay | Admitting: Primary Care

## 2023-10-24 DIAGNOSIS — F32A Depression, unspecified: Secondary | ICD-10-CM

## 2024-02-10 ENCOUNTER — Other Ambulatory Visit: Payer: Self-pay | Admitting: Primary Care

## 2024-02-10 DIAGNOSIS — G2581 Restless legs syndrome: Secondary | ICD-10-CM

## 2024-02-14 ENCOUNTER — Other Ambulatory Visit: Payer: Self-pay | Admitting: Primary Care

## 2024-02-14 DIAGNOSIS — E785 Hyperlipidemia, unspecified: Secondary | ICD-10-CM

## 2024-02-14 DIAGNOSIS — I10 Essential (primary) hypertension: Secondary | ICD-10-CM
